# Patient Record
Sex: Male | Born: 1980 | Race: Black or African American | Hispanic: No | Marital: Married | Smoking: Never smoker
Health system: Southern US, Community
[De-identification: ages and names within clinical notes are randomized; demographics above are authoritative.]

## PROBLEM LIST (undated history)

## (undated) DIAGNOSIS — M549 Dorsalgia, unspecified: Secondary | ICD-10-CM

## (undated) DIAGNOSIS — A6002 Herpesviral infection of other male genital organs: Secondary | ICD-10-CM

## (undated) HISTORY — PX: WISDOM TOOTH EXTRACTION: SHX21

---

## 2001-11-13 ENCOUNTER — Encounter: Payer: Self-pay | Admitting: Emergency Medicine

## 2001-11-13 ENCOUNTER — Emergency Department (HOSPITAL_COMMUNITY): Admission: EM | Admit: 2001-11-13 | Discharge: 2001-11-13 | Payer: Self-pay | Admitting: Emergency Medicine

## 2002-11-24 ENCOUNTER — Emergency Department (HOSPITAL_COMMUNITY): Admission: EM | Admit: 2002-11-24 | Discharge: 2002-11-24 | Payer: Self-pay | Admitting: Emergency Medicine

## 2002-11-24 ENCOUNTER — Encounter: Payer: Self-pay | Admitting: Emergency Medicine

## 2003-02-18 ENCOUNTER — Emergency Department (HOSPITAL_COMMUNITY): Admission: EM | Admit: 2003-02-18 | Discharge: 2003-02-18 | Payer: Self-pay | Admitting: Emergency Medicine

## 2005-12-07 ENCOUNTER — Emergency Department (HOSPITAL_COMMUNITY): Admission: EM | Admit: 2005-12-07 | Discharge: 2005-12-08 | Payer: Self-pay | Admitting: Emergency Medicine

## 2005-12-18 ENCOUNTER — Emergency Department (HOSPITAL_COMMUNITY): Admission: EM | Admit: 2005-12-18 | Discharge: 2005-12-18 | Payer: Self-pay | Admitting: Emergency Medicine

## 2006-09-06 ENCOUNTER — Emergency Department (HOSPITAL_COMMUNITY): Admission: EM | Admit: 2006-09-06 | Discharge: 2006-09-06 | Payer: Self-pay | Admitting: Emergency Medicine
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
## 2009-03-21 ENCOUNTER — Ambulatory Visit (HOSPITAL_COMMUNITY): Admission: RE | Admit: 2009-03-21 | Discharge: 2009-03-21 | Payer: Self-pay | Admitting: Chiropractic Medicine

## 2011-08-15 ENCOUNTER — Encounter: Payer: Self-pay | Admitting: Family Medicine

## 2011-08-15 ENCOUNTER — Ambulatory Visit (INDEPENDENT_AMBULATORY_CARE_PROVIDER_SITE_OTHER): Payer: BC Managed Care – PPO | Admitting: Family Medicine

## 2011-08-15 VITALS — BP 129/92 | HR 89 | Temp 98.2°F | Resp 18 | Ht 69.0 in | Wt 295.0 lb

## 2011-08-15 DIAGNOSIS — H6120 Impacted cerumen, unspecified ear: Secondary | ICD-10-CM

## 2011-08-15 DIAGNOSIS — IMO0001 Reserved for inherently not codable concepts without codable children: Secondary | ICD-10-CM

## 2011-08-15 DIAGNOSIS — D72819 Decreased white blood cell count, unspecified: Secondary | ICD-10-CM

## 2011-08-15 DIAGNOSIS — M62838 Other muscle spasm: Secondary | ICD-10-CM

## 2011-08-15 DIAGNOSIS — H9209 Otalgia, unspecified ear: Secondary | ICD-10-CM

## 2011-08-15 DIAGNOSIS — R5381 Other malaise: Secondary | ICD-10-CM

## 2011-08-15 DIAGNOSIS — M791 Myalgia, unspecified site: Secondary | ICD-10-CM

## 2011-08-15 LAB — POCT CBC
Granulocyte percent: 41.4 %G (ref 37–80)
MID (cbc): 0.5 (ref 0–0.9)
MPV: 8.9 fL (ref 0–99.8)
POC Granulocyte: 1 — AB (ref 2–6.9)
POC MID %: 19.7 %M — AB (ref 0–12)
Platelet Count, POC: 216 10*3/uL (ref 142–424)
RBC: 5.47 M/uL (ref 4.69–6.13)

## 2011-08-15 LAB — POCT SEDIMENTATION RATE: POCT SED RATE: 14 mm/hr (ref 0–22)

## 2011-08-15 LAB — COMPREHENSIVE METABOLIC PANEL
AST: 23 U/L (ref 0–37)
Albumin: 4.3 g/dL (ref 3.5–5.2)
Alkaline Phosphatase: 93 U/L (ref 39–117)
Potassium: 4.5 mEq/L (ref 3.5–5.3)
Sodium: 141 mEq/L (ref 135–145)
Total Bilirubin: 0.6 mg/dL (ref 0.3–1.2)
Total Protein: 7.2 g/dL (ref 6.0–8.3)

## 2011-08-15 NOTE — Progress Notes (Signed)
Patient Name: Aaron Lindsey Date of Birth: 03-09-1981 Medical Record Number: [id number] Gender: male Date of Encounter: 08/15/2011  History of Present Illness:  Aaron Lindsey is a 31 y.o. very pleasant male patient who presents with the following:  He is here today with muscle aches "all over my body" for the last 2 days.  Nothing unusual happened except 3 days ago he ate very buttery popcorn at the movies and then had a little diarrhea- 3 episodes over 2 days.  No fever, but he did note some chills yesterday for a short while.  No St, no cough.   No medications or supplements used.   He feels just like he's "had a hard work- out" but he has not been working out.   He does not feel that his joints hurt- symptoms are in his muscles.   Never had this before. He works a physical job.    Otherwise generally   He also notes that his left ear is blocked with wax- would like irrigation  There is no problem list on file for this patient.  No past medical history on file. No past surgical history on file. History  Substance Use Topics  . Smoking status: Never Smoker   . Smokeless tobacco: Not on file  . Alcohol Use: Not on file   No family history on file. No Known Allergies  Medication list has been reviewed and updated.  Review of Systems: As per HPI- otherwise negative.  Physical Examination: Filed Vitals:   08/15/11 0954  BP: 129/92  Pulse: 89  Temp: 98.2 F (36.8 C)  TempSrc: Oral  Resp: 18  Height: 5\' 9"  (1.753 m)  Weight: 295 lb (133.811 kg)   Body mass index is 43.56 kg/(m^2).  GEN: WDWN, NAD, Non-toxic, A & O x 3, obese HEENT: Atraumatic, Normocephalic. Neck supple. No masses, No LAD.  Oropharynx wnl. Left TM blocked with cerumen, right wnl Ears and Nose: No external deformity. CV: RRR, No M/G/R. No JVD. No thrill. No extra heart sounds. PULM: CTA B, no wheezes, crackles, rhonchi. No retractions. No resp. distress. No accessory muscle use. ABD: S, NT,  ND, +BS. No rebound. No HSM. EXTR: No c/c/e NEURO Normal gait.  PSYCH: Normally interactive. Conversant. Not depressed or anxious appearing.  Calm demeanor.  MSK: no pain or swelling on palpation of extremities: muscles and joints seem normal and he has normal ROM and strength  Left ear irrigated- wax removed, normal TM after irrigation  Results for orders placed in visit on 08/15/11  POCT CBC      Component Value Range   WBC 2.3 (*) 4.6 - 10.2 (K/uL)   Lymph, poc 0.9  0.6 - 3.4    POC LYMPH PERCENT 38.9  10 - 50 (%L)   MID (cbc) 0.5  0 - 0.9    POC MID % 19.7 (*) 0 - 12 (%M)   POC Granulocyte 1.0 (*) 2 - 6.9    Granulocyte percent 41.4  37 - 80 (%G)   RBC 5.47  4.69 - 6.13 (M/uL)   Hemoglobin 15.0  14.1 - 18.1 (g/dL)   HCT, POC 40.9  81.1 - 53.7 (%)   MCV 84.1  80 - 97 (fL)   MCH, POC 27.4  27 - 31.2 (pg)   MCHC 32.6  31.8 - 35.4 (g/dL)   RDW, POC 91.4     Platelet Count, POC 216  142 - 424 (K/uL)   MPV 8.9  0 - 99.8 (fL)  POCT  SEDIMENTATION RATE      Component Value Range   POCT SED RATE 14  0 - 22 (mm/hr)  POCT INFLUENZA A/B      Component Value Range   Influenza A, POC Negative     Influenza B, POC Negative     Review old CBC in chart from 2007- normal wbc count Assessment and Plan: 1. Muscle pain  POCT CBC, POCT SEDIMENTATION RATE, Comprehensive metabolic panel, ANA, CK, POCT Influenza A/B  2. Leukopenia     Suspect that Wing has a viral illness causing his symptoms- however will check other labs as above.  Cautioned him to keep an eye on his temperature and his symptoms- if he gets worse or has other symptoms such as fever please call or RTC.  Otherwise I will check on him in a day or so when the rest of his labs come in.

## 2011-08-16 ENCOUNTER — Encounter: Payer: Self-pay | Admitting: Family Medicine

## 2013-11-10 ENCOUNTER — Emergency Department (HOSPITAL_COMMUNITY)
Admission: EM | Admit: 2013-11-10 | Discharge: 2013-11-10 | Disposition: A | Discharge status: Home / Self Care | Payer: BC Managed Care – PPO | Source: Home / Self Care | Attending: Family Medicine | Admitting: Family Medicine

## 2013-11-10 ENCOUNTER — Other Ambulatory Visit (HOSPITAL_COMMUNITY)
Admission: RE | Admit: 2013-11-10 | Discharge: 2013-11-10 | Disposition: A | Discharge status: Home / Self Care | Payer: BC Managed Care – PPO | Source: Ambulatory Visit | Attending: Family Medicine | Admitting: Family Medicine

## 2013-11-10 ENCOUNTER — Encounter (HOSPITAL_COMMUNITY): Payer: Self-pay | Admitting: Emergency Medicine

## 2013-11-10 DIAGNOSIS — Z113 Encounter for screening for infections with a predominantly sexual mode of transmission: Secondary | ICD-10-CM | POA: Insufficient documentation

## 2013-11-10 DIAGNOSIS — N4889 Other specified disorders of penis: Secondary | ICD-10-CM

## 2013-11-10 LAB — POCT URINALYSIS DIP (DEVICE)
Bilirubin Urine: NEGATIVE
Glucose, UA: NEGATIVE mg/dL
Hgb urine dipstick: NEGATIVE
Ketones, ur: NEGATIVE mg/dL
Leukocytes, UA: NEGATIVE
NITRITE: NEGATIVE
PROTEIN: 30 mg/dL — AB
SPECIFIC GRAVITY, URINE: 1.02 (ref 1.005–1.030)
UROBILINOGEN UA: 1 mg/dL (ref 0.0–1.0)
pH: 7 (ref 5.0–8.0)

## 2013-11-10 MED ORDER — VALACYCLOVIR HCL 500 MG PO TABS: 500.0000 mg | ORAL_TABLET | Freq: Two times a day (BID) | ORAL | Status: AC

## 2013-11-10 NOTE — Discharge Instructions (Signed)
Thank you for coming in today. Take valtrex twice daily for 5 days.  Come back as needed.   Genital Herpes Genital herpes is a sexually transmitted disease. This means that it is a disease passed by having sex with an infected person. There is no cure for genital herpes. The time between attacks can be months to years. The virus may live in a person but produce no problems (symptoms). This infection can be passed to a baby as it travels down the birth canal (vagina). In a newborn, this can cause central nervous system damage, eye damage, or even death. The virus that causes genital herpes is usually HSV-2 virus. The virus that causes oral herpes is usually HSV-1. The diagnosis (learning what is wrong) is made through culture results. SYMPTOMS  Usually symptoms of pain and itching begin a few days to a week after contact. It first appears as small blisters that progress to small painful ulcers which then scab over and heal after several days. It affects the outer genitalia, birth canal, cervix, penis, anal area, buttocks, and thighs. HOME CARE INSTRUCTIONS   Keep ulcerated areas dry and clean.  Take medications as directed. Antiviral medications can speed up healing. They will not prevent recurrences or cure this infection. These medications can also be taken for suppression if there are frequent recurrences.  While the infection is active, it is contagious. Avoid all sexual contact during active infections.  Condoms may help prevent spread of the herpes virus.  Practice safe sex.  Wash your hands thoroughly after touching the genital area.  Avoid touching your eyes after touching your genital area.  Inform your caregiver if you have had genital herpes and become pregnant. It is your responsibility to insure a safe outcome for your baby in this pregnancy.  Only take over-the-counter or prescription medicines for pain, discomfort, or fever as directed by your caregiver. SEEK MEDICAL CARE IF:    You have a recurrence of this infection.  You do not respond to medications and are not improving.  You have new sources of pain or discharge which have changed from the original infection.  You have an oral temperature above 102 F (38.9 C).  You develop abdominal pain.  You develop eye pain or signs of eye infection. Document Released: 03/23/2000 Document Revised: 06/18/2011 Document Reviewed: 04/13/2009 [facility] Patient Information 2015 [employer]. This information is not intended to replace advice given to you by your health care provider. Make sure you discuss any questions you have with your health care provider.

## 2013-11-10 NOTE — ED Provider Notes (Signed)
Aaron Lindsey is a 33 y.o. male who presents to Urgent Care today for penile tingling. Patient has a history of genital herpes and notes over the past few days tingling. He denies any rash or discharge. He feels well otherwise. He feels well otherwise. No fevers or chills nausea vomiting or diarrhea.   History reviewed. No pertinent past medical history. History  Substance Use Topics  . Smoking status: Never Smoker   . Smokeless tobacco: Not on file  . Alcohol Use: Not on file   ROS as above Medications: No current facility-administered medications for this encounter.   Current Outpatient Prescriptions  Medication Sig Dispense Refill  . valACYclovir (VALTREX) 500 MG tablet Take 1 tablet (500 mg total) by mouth 2 (two) times daily.  10 tablet  0    Exam:  BP 123/80  Pulse 77  Temp(Src) 99.3 F (37.4 C) (Oral)  Resp 16  SpO2 99% Gen: Well NAD Genitals: Normal appearing penis. No lesions noted. Testicles are descended bilaterally and nontender with no mass.  Results for orders placed during the hospital encounter of 11/10/13 (from the past 24 hour(s))  POCT URINALYSIS DIP (DEVICE)     Status: Abnormal   Collection Time    11/10/13  8:49 AM      Result Value Ref Range   Glucose, UA NEGATIVE  NEGATIVE mg/dL   Bilirubin Urine NEGATIVE  NEGATIVE   Ketones, ur NEGATIVE  NEGATIVE mg/dL   Specific Gravity, Urine 1.020  1.005 - 1.030   Hgb urine dipstick NEGATIVE  NEGATIVE   pH 7.0  5.0 - 8.0   Protein, ur 30 (*) NEGATIVE mg/dL   Urobilinogen, UA 1.0  0.0 - 1.0 mg/dL   Nitrite NEGATIVE  NEGATIVE   Leukocytes, UA NEGATIVE  NEGATIVE   No results found.  Assessment and Plan: 33 y.o. male with penile tingling. Likely onset of herpes outbreak. Plan to treat with Valtrex. Followup as needed. Urine cytology pending  Discussed warning signs or symptoms. Please see discharge instructions. Patient expresses understanding.   This note was created using [employer] voice recognition  software. Any transcription errors are unintended.    Rodolph BongEvan S Svea Pusch, MD 11/10/13 [phone]

## 2013-11-10 NOTE — ED Notes (Signed)
C/o cystitis  See physician note

## 2013-11-12 ENCOUNTER — Emergency Department (INDEPENDENT_AMBULATORY_CARE_PROVIDER_SITE_OTHER)
Admission: EM | Admit: 2013-11-12 | Discharge: 2013-11-12 | Disposition: A | Discharge status: Home / Self Care | Payer: BC Managed Care – PPO | Source: Home / Self Care | Attending: Emergency Medicine | Admitting: Emergency Medicine

## 2013-11-12 ENCOUNTER — Telehealth (HOSPITAL_COMMUNITY): Payer: Self-pay | Admitting: *Deleted

## 2013-11-12 ENCOUNTER — Encounter (HOSPITAL_COMMUNITY): Payer: Self-pay | Admitting: Emergency Medicine

## 2013-11-12 DIAGNOSIS — Z202 Contact with and (suspected) exposure to infections with a predominantly sexual mode of transmission: Secondary | ICD-10-CM

## 2013-11-12 HISTORY — DX: Herpesviral infection of other male genital organs: A60.02

## 2013-11-12 NOTE — Discharge Instructions (Signed)
You have been diagnosed with a possible STD.  Your results should be back in  1 - 3 days.  We will call you with the results of any positive tests, so if you don't hear from us, you can assume your results are all negative.  If you wish, you can call us here at [phone] and ask for a nurse to give you the results.  They can give you the results of all your tests over the phone.  If your HIV should come back positive, we must give you this result in person to protect your confidentiality.  We can give you a negative HIV result over the phone.   ° °In the meantime, you should avoid intercourse altogether for 1 week.  After that, you should always use condoms--100% of the time.  This will not only prevent pregnancy, but has been shown to prevent HIV, syphilis, gonorrhea, chlamydia, hepatis C and other STDs. ° °If your test comes back positive, we are required by law to report it to the Health Department.  We also suggest you inform your partner or partners so they can get tested and treated as well. ° °You can get STD testing for free at the [facility].  It is recommended that you have repeat testing for HIV and syphilis in 3 and 6 months, since it can take a while for these tests to become positive.  ° °

## 2013-11-12 NOTE — ED Notes (Signed)
Pt. Cclled 4 x for his lab results on the VM. I called pt. and told him I was not here yesterday and did not come in today until 1400 but I had received all his calls.  Pt. verified x 2 and given results. ( GC/Chlamydia/Trich all neg.) Pt. asked if that was all we tested for.  I said yes.  If he wanted HIV/RPR he should have asked the doctor while he was here. He asked about the Herpes test. I explained that he would need to have a sore that we can swab and do a culture.  I advised to practice safe sex and always wear a condom. If he wants the other tests [facility].   Vassie MoselleYork, Emerson Schreifels M 11/12/2013

## 2013-11-12 NOTE — ED Provider Notes (Signed)
  Chief Complaint   Chief Complaint  Patient presents with  . Exposure to STD    History of Present Illness   Aaron NayChristopher Lindsey is a 33 year old male who was here 3 days ago with a tingly feeling on the tip of his penis. He underwent DNA probe testing which ultimately was negative. He did not have any serologies drawn. He was given Valtrex because of a history of HSV in the past. The tingling has persisted. He states it sometimes radiates down into his feet. He denies any urethral discharge or penile lesions. No frequency, urgency, dysuria, lower back, or lower abdominal pain. No fever, chills, sore throat, skin rash, or joint pain. He comes in today for serologies for HIV syphilis, since he wants to rule these diseases out and he has had unprotected intercourse.  Review of Systems   Other than as noted above, the patient denies any of the following symptoms: Systemic:  No fevers chills, arthralgias, or adenopathy. GI:  No abdominal pain, nausea or vomiting. GU:  No dysuria, penile pain, discharge, itching, dysuria, genital lesions, testicular pain or swelling. Skin:  No rash or itching.  PMFSH   Past medical history, family history, social history, meds, and allergies were reviewed.   Physical Examination    Vital signs:  BP 122/86  Pulse 65  Temp(Src) 98.1 F (36.7 C) (Oral)  Resp 16  SpO2 98% Gen:  Alert, oriented, in no distress. Abdomen:  Soft and flat, non-distended, and non-tender.  No organomegaly or mass. Genital:  Normal exam. No urethral discharge, no penile lesions. Testes were normal. No inguinal lymphadenopathy. Skin:  Warm and dry.  No rash.   Labs   At his request serologies for HIV and syphilis were obtained.  Assessment   The encounter diagnosis was Potential exposure to STD.  Plan    1.  Meds:  The following meds were prescribed:   Discharge Medication List as of 11/12/2013  5:51 PM      2.  Patient Education/Counseling:  The patient was given  appropriate handouts, self care instructions, and instructed in symptomatic relief.The patient was instructed to inform all sexual contacts, avoid intercourse completely for 2 weeks and then only with a condom.  The patient was told that we would call about all abnormal lab results, and that we would need to report certain kinds of infection to the health department.    3.  Follow up:  The patient was told to follow up here if no better in 3 to 4 days, or sooner if becoming worse in any way, and given some red flag symptoms such as fever, pain, or difficulty urinating which would prompt immediate return.       Reuben Likesavid C Trini Christiansen, MD 11/12/13 2119

## 2013-11-12 NOTE — ED Notes (Signed)
Here for HIV/RPR testing.  Pt. had neg. Testing for the others on 8/4.  Pt. already knows he is pos. for Herpes.

## 2013-11-13 LAB — RPR

## 2013-11-13 LAB — HIV ANTIBODY (ROUTINE TESTING W REFLEX): HIV: NONREACTIVE

## 2014-07-21 ENCOUNTER — Other Ambulatory Visit: Payer: Self-pay | Admitting: Sports Medicine

## 2014-07-21 DIAGNOSIS — M545 Low back pain, unspecified: Secondary | ICD-10-CM

## 2014-07-26 ENCOUNTER — Ambulatory Visit
Admission: RE | Admit: 2014-07-26 | Discharge: 2014-07-26 | Disposition: A | Discharge status: Home / Self Care | Payer: BLUE CROSS/BLUE SHIELD | Source: Ambulatory Visit | Attending: Sports Medicine | Admitting: Sports Medicine

## 2014-07-26 VITALS — BP 158/90 | HR 72

## 2014-07-26 DIAGNOSIS — M545 Low back pain, unspecified: Secondary | ICD-10-CM

## 2014-07-26 DIAGNOSIS — M5137 Other intervertebral disc degeneration, lumbosacral region: Secondary | ICD-10-CM

## 2014-07-26 MED ORDER — METHYLPREDNISOLONE ACETATE 40 MG/ML INJ SUSP (RADIOLOG
120.0000 mg | Freq: Once | INTRAMUSCULAR | Status: AC
  Administered 2014-07-26: 120 mg via EPIDURAL

## 2014-07-26 MED ORDER — IOHEXOL 180 MG/ML  SOLN
1.0000 mL | Freq: Once | INTRAMUSCULAR | Status: AC | PRN
  Administered 2014-07-26: 1 mL via EPIDURAL

## 2014-07-26 NOTE — Discharge Instructions (Signed)

## 2014-07-28 ENCOUNTER — Other Ambulatory Visit: Payer: Self-pay

## 2014-08-06 ENCOUNTER — Other Ambulatory Visit: Payer: Self-pay | Admitting: Sports Medicine

## 2014-08-06 DIAGNOSIS — M5126 Other intervertebral disc displacement, lumbar region: Secondary | ICD-10-CM

## 2014-08-13 ENCOUNTER — Ambulatory Visit
Admission: RE | Admit: 2014-08-13 | Discharge: 2014-08-13 | Disposition: A | Discharge status: Home / Self Care | Payer: BLUE CROSS/BLUE SHIELD | Source: Ambulatory Visit | Attending: Sports Medicine | Admitting: Sports Medicine

## 2014-08-13 DIAGNOSIS — M5126 Other intervertebral disc displacement, lumbar region: Secondary | ICD-10-CM

## 2014-08-13 MED ORDER — METHYLPREDNISOLONE ACETATE 40 MG/ML INJ SUSP (RADIOLOG
120.0000 mg | Freq: Once | INTRAMUSCULAR | Status: AC
  Administered 2014-08-13: 120 mg via EPIDURAL

## 2014-08-13 MED ORDER — IOHEXOL 180 MG/ML  SOLN
1.0000 mL | Freq: Once | INTRAMUSCULAR | Status: AC | PRN
  Administered 2014-08-13: 1 mL via EPIDURAL

## 2016-09-06 DIAGNOSIS — M5432 Sciatica, left side: Secondary | ICD-10-CM | POA: Insufficient documentation

## 2016-09-06 DIAGNOSIS — R51 Headache: Secondary | ICD-10-CM | POA: Diagnosis present

## 2016-09-07 ENCOUNTER — Emergency Department (HOSPITAL_COMMUNITY)
Admission: EM | Admit: 2016-09-07 | Discharge: 2016-09-07 | Disposition: A | Discharge status: Home / Self Care | Payer: BLUE CROSS/BLUE SHIELD | Attending: Emergency Medicine | Admitting: Emergency Medicine

## 2016-09-07 ENCOUNTER — Encounter (HOSPITAL_COMMUNITY): Payer: Self-pay

## 2016-09-07 DIAGNOSIS — R519 Headache, unspecified: Secondary | ICD-10-CM

## 2016-09-07 DIAGNOSIS — M5432 Sciatica, left side: Secondary | ICD-10-CM

## 2016-09-07 DIAGNOSIS — R51 Headache: Secondary | ICD-10-CM

## 2016-09-07 HISTORY — DX: Dorsalgia, unspecified: M54.9

## 2016-09-07 MED ORDER — KETOROLAC TROMETHAMINE 30 MG/ML IJ SOLN
30.0000 mg | Freq: Once | INTRAMUSCULAR | Status: AC
  Administered 2016-09-07: 30 mg via INTRAVENOUS
  Filled 2016-09-07: qty 1

## 2016-09-07 MED ORDER — MORPHINE SULFATE (PF) 2 MG/ML IV SOLN
4.0000 mg | Freq: Once | INTRAVENOUS | Status: AC
  Administered 2016-09-07: 4 mg via INTRAVENOUS
  Filled 2016-09-07: qty 2

## 2016-09-07 MED ORDER — ORPHENADRINE CITRATE ER 100 MG PO TB12: 100.0000 mg | ORAL_TABLET | Freq: Two times a day (BID) | ORAL | 0 refills | Status: DC

## 2016-09-07 MED ORDER — METHOCARBAMOL 1000 MG/10ML IJ SOLN: 1000.0000 mg | Freq: Once | INTRAMUSCULAR | Status: DC

## 2016-09-07 MED ORDER — DIPHENHYDRAMINE HCL 50 MG/ML IJ SOLN
25.0000 mg | Freq: Once | INTRAMUSCULAR | Status: AC
  Administered 2016-09-07: 25 mg via INTRAVENOUS
  Filled 2016-09-07: qty 1

## 2016-09-07 MED ORDER — SODIUM CHLORIDE 0.9 % IV BOLUS (SEPSIS)
1000.0000 mL | Freq: Once | INTRAVENOUS | Status: AC
  Administered 2016-09-07: 1000 mL via INTRAVENOUS

## 2016-09-07 MED ORDER — DEXTROSE 5 % IV SOLN
1000.0000 mg | Freq: Once | INTRAVENOUS | Status: AC
  Administered 2016-09-07: 1000 mg via INTRAVENOUS
  Filled 2016-09-07: qty 10

## 2016-09-07 MED ORDER — NAPROXEN 500 MG PO TABS: 500.0000 mg | ORAL_TABLET | Freq: Two times a day (BID) | ORAL | 0 refills | Status: DC

## 2016-09-07 MED ORDER — METOCLOPRAMIDE HCL 5 MG/ML IJ SOLN
10.0000 mg | Freq: Once | INTRAMUSCULAR | Status: AC
  Administered 2016-09-07: 10 mg via INTRAVENOUS
  Filled 2016-09-07: qty 2

## 2016-09-07 NOTE — Discharge Instructions (Signed)
Continue to take your hydrocodone-acetaminophen as needed for pain.

## 2016-09-07 NOTE — ED Provider Notes (Signed)
[facility]-EMERGENCY DEPT Provider Note   CSN: [id number] Arrival date & time: 09/06/16  2322  By signing my name below, I, Aaron Lindsey, attest that this documentation has been prepared under the direction and in the presence of Dione Booze, MD. Electronically Signed: Karren Cobble, ED Scribe. 09/07/16. 2:53 AM.  History   Chief Complaint Chief Complaint  Patient presents with  . Headache   The history is provided by the patient. No language interpreter was used.    HPI HPI Comments: Aaron Lindsey is a 36 y.o. male who presents to the Emergency Department complaining of throbbing frontal headache for one day. He notes the pain is associated with his chronic back pain. Pt also reports nausea with weakness, numbness, and tingling in his left leg. Pt reports his sciatic nerve pain is causing him to have this severe headache. He notes he is unable to get comfortable and sleep due to the pain. Pt is currently followed for pain management. Not followed by a neurologist. Denies emesis.   Past Medical History:  Diagnosis Date  . Back pain   . Herpes genitalis in men    There are no active problems to display for this patient.  History reviewed. No pertinent surgical history.  Home Medications    Prior to Admission medications   Not on File   Family History Family History  Problem Relation Age of Onset  . Stroke Mother   . Heart failure Father    Social History Social History  Substance Use Topics  . Smoking status: Never Smoker  . Smokeless tobacco: Never Used  . Alcohol use No   Allergies   Patient has no known allergies.  Review of Systems Review of Systems  Musculoskeletal: Positive for back pain.  Neurological: Positive for weakness, numbness and headaches.       Tingling to the left leg.   Psychiatric/Behavioral: Positive for sleep disturbance.   Physical Exam Updated Vital Signs BP (!) 162/123 (BP Location: Right Arm)   Pulse (!) 112   Temp 98.3 F (36.8 C)  (Oral)   Resp 20   SpO2 100%   Physical Exam  Constitutional: He is oriented to person, place, and time. He appears well-developed and well-nourished.  HENT:  Head: Normocephalic and atraumatic.  Eyes: EOM are normal. Pupils are equal, round, and reactive to light.  Neck: Normal range of motion. Neck supple. No JVD present.  Cardiovascular: Normal rate, regular rhythm and normal heart sounds.   No murmur heard. Pulmonary/Chest: Effort normal and breath sounds normal. He has no wheezes. He has no rales. He exhibits no tenderness.  Abdominal: Soft. Bowel sounds are normal. He exhibits no distension and no mass. There is no tenderness.  Musculoskeletal: Normal range of motion. He exhibits no edema.  +straight leg raise left @ 30 degrees  +crossed leg raise on right @ 30 degree  Lymphadenopathy:    He has no cervical adenopathy.  Neurological: He is alert and oriented to person, place, and time. No cranial nerve deficit. He exhibits normal muscle tone. Coordination normal.  Decreased senstaion in the left foot. Not following dermatone distrubution.   Skin: Skin is warm and dry. No rash noted.  Psychiatric: He has a normal mood and affect. His behavior is normal. Judgment and thought content normal.  Nursing note and vitals reviewed.  ED Treatments / Results  DIAGNOSTIC STUDIES: Oxygen Saturation is 100% on RA, normal by my interpretation.   COORDINATION OF CARE: 2:08 AM-Discussed next steps with pt. Pt  verbalized understanding and is agreeable with the plan.   Procedures Procedures (including critical care time)  Medications Ordered in ED Medications  sodium chloride 0.9 % bolus 1,000 mL (0 mLs Intravenous Stopped 09/07/16 0410)  ketorolac (TORADOL) 30 MG/ML injection 30 mg (30 mg Intravenous Given 09/07/16 0250)  metoCLOPramide (REGLAN) injection 10 mg (10 mg Intravenous Given 09/07/16 0250)  diphenhydrAMINE (BENADRYL) injection 25 mg (25 mg Intravenous Given 09/07/16 0250)  morphine  2 MG/ML injection 4 mg (4 mg Intravenous Given 09/07/16 0442)  methocarbamol (ROBAXIN) 1,000 mg in dextrose 5 % 50 mL IVPB (0 mg Intravenous Stopped 09/07/16 0615)     Initial Impression / Assessment and Plan / ED Course  I have reviewed the triage vital signs and the nursing notes.  Headache which I suspect is mostly muscle contraction headache. No red flags to suggest more serious causes. Left-sided sciatica. No evidence of acute neurologic deficit. Old records are reviewed, and he has no relevant past visits. Is given a headache cocktail of normal saline, metoclopramide, diphenhydramine, ketorolac which did not seem to give him any relief. He was given a dose of morphine and methocarbamol. Following this, headache was on this completely gone, and sciatic pain was improved but still present. He will need neurosurgical evaluation regarding his sciatica. He is discharged with prescriptions for orphenadrine and naproxen. He is seeing a pain management physician and is to continue taking his narcotics as prescribed from pain management.  Final Clinical Impressions(s) / ED Diagnoses   Final diagnoses:  Bad headache  Left sided sciatica    New Prescriptions New Prescriptions   NAPROXEN (NAPROSYN) 500 MG TABLET    Take 1 tablet (500 mg total) by mouth 2 (two) times daily.   ORPHENADRINE (NORFLEX) 100 MG TABLET    Take 1 tablet (100 mg total) by mouth 2 (two) times daily.   I personally performed the services described in this documentation, which was scribed in my presence. The recorded information has been reviewed and is accurate.       Dione BoozeGlick, Iman Reinertsen, MD 09/07/16 [phone]

## 2016-09-07 NOTE — ED Triage Notes (Signed)
Pt complains of chronic back pain from sciatica which is causing him to have a severe headache and he feels nauseated Pt states he's taking vicodin and doesn't like it

## 2016-09-26 ENCOUNTER — Other Ambulatory Visit: Payer: Self-pay | Admitting: Sports Medicine

## 2016-09-26 DIAGNOSIS — M545 Low back pain: Secondary | ICD-10-CM

## 2016-10-09 ENCOUNTER — Ambulatory Visit
Admission: RE | Admit: 2016-10-09 | Discharge: 2016-10-09 | Disposition: A | Discharge status: Home / Self Care | Payer: BLUE CROSS/BLUE SHIELD | Source: Ambulatory Visit | Attending: Sports Medicine | Admitting: Sports Medicine

## 2016-10-09 DIAGNOSIS — M545 Low back pain: Secondary | ICD-10-CM

## 2016-11-28 ENCOUNTER — Emergency Department (HOSPITAL_COMMUNITY)
Admission: EM | Admit: 2016-11-28 | Discharge: 2016-11-28 | Disposition: A | Discharge status: Home / Self Care | Payer: BLUE CROSS/BLUE SHIELD | Attending: Emergency Medicine | Admitting: Emergency Medicine

## 2016-11-28 ENCOUNTER — Emergency Department (HOSPITAL_COMMUNITY): Payer: BLUE CROSS/BLUE SHIELD

## 2016-11-28 DIAGNOSIS — R55 Syncope and collapse: Secondary | ICD-10-CM | POA: Insufficient documentation

## 2016-11-28 DIAGNOSIS — Z79899 Other long term (current) drug therapy: Secondary | ICD-10-CM | POA: Diagnosis not present

## 2016-11-28 LAB — URINALYSIS, ROUTINE W REFLEX MICROSCOPIC
BILIRUBIN URINE: NEGATIVE
GLUCOSE, UA: NEGATIVE mg/dL
Hgb urine dipstick: NEGATIVE
KETONES UR: 5 mg/dL — AB
Nitrite: NEGATIVE
PH: 7 (ref 5.0–8.0)
Protein, ur: 30 mg/dL — AB
Specific Gravity, Urine: 1.02 (ref 1.005–1.030)

## 2016-11-28 LAB — BASIC METABOLIC PANEL
Anion gap: 6 (ref 5–15)
BUN: 6 mg/dL (ref 6–20)
CALCIUM: 8.6 mg/dL — AB (ref 8.9–10.3)
CHLORIDE: 108 mmol/L (ref 101–111)
CO2: 27 mmol/L (ref 22–32)
CREATININE: 1.23 mg/dL (ref 0.61–1.24)
GFR calc non Af Amer: >60 mL/min (ref >60)
GLUCOSE: 117 mg/dL — AB (ref 65–99)
Potassium: 3.9 mmol/L (ref 3.5–5.1)
Sodium: 141 mmol/L (ref 135–145)

## 2016-11-28 LAB — CBC
HEMATOCRIT: 41 % (ref 39.0–52.0)
HEMOGLOBIN: 13.6 g/dL (ref 13.0–17.0)
MCH: 27.4 pg (ref 26.0–34.0)
MCHC: 33.2 g/dL (ref 30.0–36.0)
MCV: 82.5 fL (ref 78.0–100.0)
Platelets: 183 10*3/uL (ref 150–400)
RBC: 4.97 MIL/uL (ref 4.22–5.81)
RDW: 14.4 % (ref 11.5–15.5)
WBC: 3.7 10*3/uL — ABNORMAL LOW (ref 4.0–10.5)

## 2016-11-28 LAB — HEPATIC FUNCTION PANEL
ALK PHOS: 51 U/L (ref 38–126)
ALT: 21 U/L (ref 17–63)
AST: 28 U/L (ref 15–41)
Albumin: 3.4 g/dL — ABNORMAL LOW (ref 3.5–5.0)
BILIRUBIN INDIRECT: 1 mg/dL — AB (ref 0.3–0.9)
BILIRUBIN TOTAL: 1.2 mg/dL (ref 0.3–1.2)
Bilirubin, Direct: 0.2 mg/dL (ref 0.1–0.5)
TOTAL PROTEIN: 5.4 g/dL — AB (ref 6.5–8.1)

## 2016-11-28 LAB — CBG MONITORING, ED: Glucose-Capillary: 113 mg/dL — ABNORMAL HIGH (ref 65–99)

## 2016-11-28 LAB — TROPONIN I: Troponin I: <0.03 ng/mL (ref <0.03)

## 2016-11-28 LAB — CK: CK TOTAL: 549 U/L — AB (ref 49–397)

## 2016-11-28 MED ORDER — SODIUM CHLORIDE 0.9 % IV SOLN
INTRAVENOUS | Status: DC
  Administered 2016-11-28: 15:00:00 via INTRAVENOUS

## 2016-11-28 MED ORDER — SODIUM CHLORIDE 0.9 % IV BOLUS (SEPSIS)
2000.0000 mL | Freq: Once | INTRAVENOUS | Status: AC
  Administered 2016-11-28: 2000 mL via INTRAVENOUS

## 2016-11-28 NOTE — ED Notes (Signed)
Bed: WA13 Expected date:  Expected time:  Means of arrival:  Comments: EMS syncope 

## 2016-11-28 NOTE — ED Triage Notes (Signed)
Per EMS, pt c/o 2 closely-spaced episode syncopes with fall from standing, onset today after first day of weight lifting "in a long time." Pt reports taking "Hydroxycut" x 2 weeks and not drinking enough water today. Pt denies any symptoms currently. No CP or SOB with syncope. 500 mL NS administered en route.

## 2016-11-28 NOTE — ED Provider Notes (Signed)
[facility] Provider Note   CSN: [id number] Arrival date & time: 11/28/16  1311     History   Chief Complaint Chief Complaint  Patient presents with  . Loss of Consciousness  . Hypotension    HPI Aaron Lindsey is a 36 y.o. male.  36 year old male here after syncopal event while working out.this is patient's second day working on after a prolonged absence from physical training. Notes that he only had a banana and water and then took Hydroxycut which is a stimulant. Had 2 episodes of becoming dizzy after exercise. Denies any chest pain or shortness of breath. States when he went to stand up he had a brief syncopal event for about 3 seconds. Could tell that he was going to pass out. Did not have any seizure or postictal phase. EMS was called and patient given IV fluids and patient feels much better now. He denies any dark urine at this time.      Past Medical History:  Diagnosis Date  . Back pain   . Herpes genitalis in men     There are no active problems to display for this patient.   No past surgical history on file.     Home Medications    Prior to Admission medications   Medication Sig Start Date End Date Taking? Authorizing Provider  HYDROcodone-acetaminophen (NORCO) 10-325 MG tablet Take 1 tablet by mouth 3 (three) times daily.    [provider]  montelukast (SINGULAIR) 10 MG tablet Take 10 mg by mouth at bedtime.    [provider]  naproxen (NAPROSYN) 500 MG tablet Take 1 tablet (500 mg total) by mouth 2 (two) times daily. 09/07/16   Dione Booze, MD  orphenadrine (NORFLEX) 100 MG tablet Take 1 tablet (100 mg total) by mouth 2 (two) times daily. 09/07/16   Dione Booze, MD    Family History Family History  Problem Relation Age of Onset  . Stroke Mother   . Heart failure Father     Social History Social History  Substance Use Topics  . Smoking status: Never Smoker  . Smokeless tobacco: Never Used  . Alcohol use No      Allergies   Patient has no known allergies.   Review of Systems Review of Systems  All other systems reviewed and are negative.    Physical Exam Updated Vital Signs BP 112/76   Pulse 86   Temp 98.2 F (36.8 C) (Oral)   Resp (!) 24   SpO2 98%   Physical Exam  Constitutional: He is oriented to person, place, and time. He appears well-developed and well-nourished.  Non-toxic appearance. No distress.  HENT:  Head: Normocephalic and atraumatic.  Eyes: Pupils are equal, round, and reactive to light. Conjunctivae, EOM and lids are normal.  Neck: Normal range of motion. Neck supple. No tracheal deviation present. No thyroid mass present.  Cardiovascular: Normal rate, regular rhythm and normal heart sounds.  Exam reveals no gallop.   No murmur heard. Pulmonary/Chest: Effort normal and breath sounds normal. No stridor. No respiratory distress. He has no decreased breath sounds. He has no wheezes. He has no rhonchi. He has no rales.  Abdominal: Soft. Normal appearance and bowel sounds are normal. He exhibits no distension. There is no tenderness. There is no rebound and no CVA tenderness.  Musculoskeletal: Normal range of motion. He exhibits no edema or tenderness.  Neurological: He is alert and oriented to person, place, and time. He has normal strength. No cranial nerve deficit  or sensory deficit. GCS eye subscore is 4. GCS verbal subscore is 5. GCS motor subscore is 6.  Skin: Skin is warm and dry. No abrasion and no rash noted.  Psychiatric: He has a normal mood and affect. His speech is normal and behavior is normal.  Nursing note and vitals reviewed.    ED Treatments / Results  Labs (all labs ordered are listed, but only abnormal results are displayed) Labs Reviewed  CBC - Abnormal; Notable for the following:       Result Value   WBC 3.7 (*)    All other components within normal limits  CBG MONITORING, ED - Abnormal; Notable for the following:    Glucose-Capillary 113  (*)    All other components within normal limits  BASIC METABOLIC PANEL  URINALYSIS, ROUTINE W REFLEX MICROSCOPIC  HEPATIC FUNCTION PANEL  CK    EKG  EKG Interpretation  Date/Time:  Wednesday November 28 2016 13:28:12 EDT Ventricular Rate:  76 PR Interval:    QRS Duration: 87 QT Interval:  380 QTC Calculation: 428 R Axis:   4 Text Interpretation:  Sinus rhythm Confirmed by Lorre Nick ([id number]) on 11/28/2016 1:55:14 PM       Radiology No results found.  Procedures Procedures (including critical care time)  Medications Ordered in ED Medications  sodium chloride 0.9 % bolus 2,000 mL (not administered)  0.9 %  sodium chloride infusion (not administered)     Initial Impression / Assessment and Plan / ED Course  I have reviewed the triage vital signs and the nursing notes.  Pertinent labs & imaging results that were available during my care of the patient were reviewed by me and considered in my medical decision making (see chart for details).     Patient given IV fluids here and is not orthostatic. No evidence of rhabdomyolysis. EKG is reassuring. Troponin negative. Feels better likely vagal episode and patient stable for discharge  Final Clinical Impressions(s) / ED Diagnoses   Final diagnoses:  None    New Prescriptions New Prescriptions   No medications on file     Lorre Nick, MD 11/28/16 1517

## 2018-09-19 IMAGING — CR DG CHEST 2V
2 series · 2 of 2 positions shown · non-contrast
Comparison: 09/09/2006 chest radiograph

CLINICAL DATA: 36 y/o  M; syncopal episodes.

EXAM:
CHEST  2 VIEW

[w chest pa]
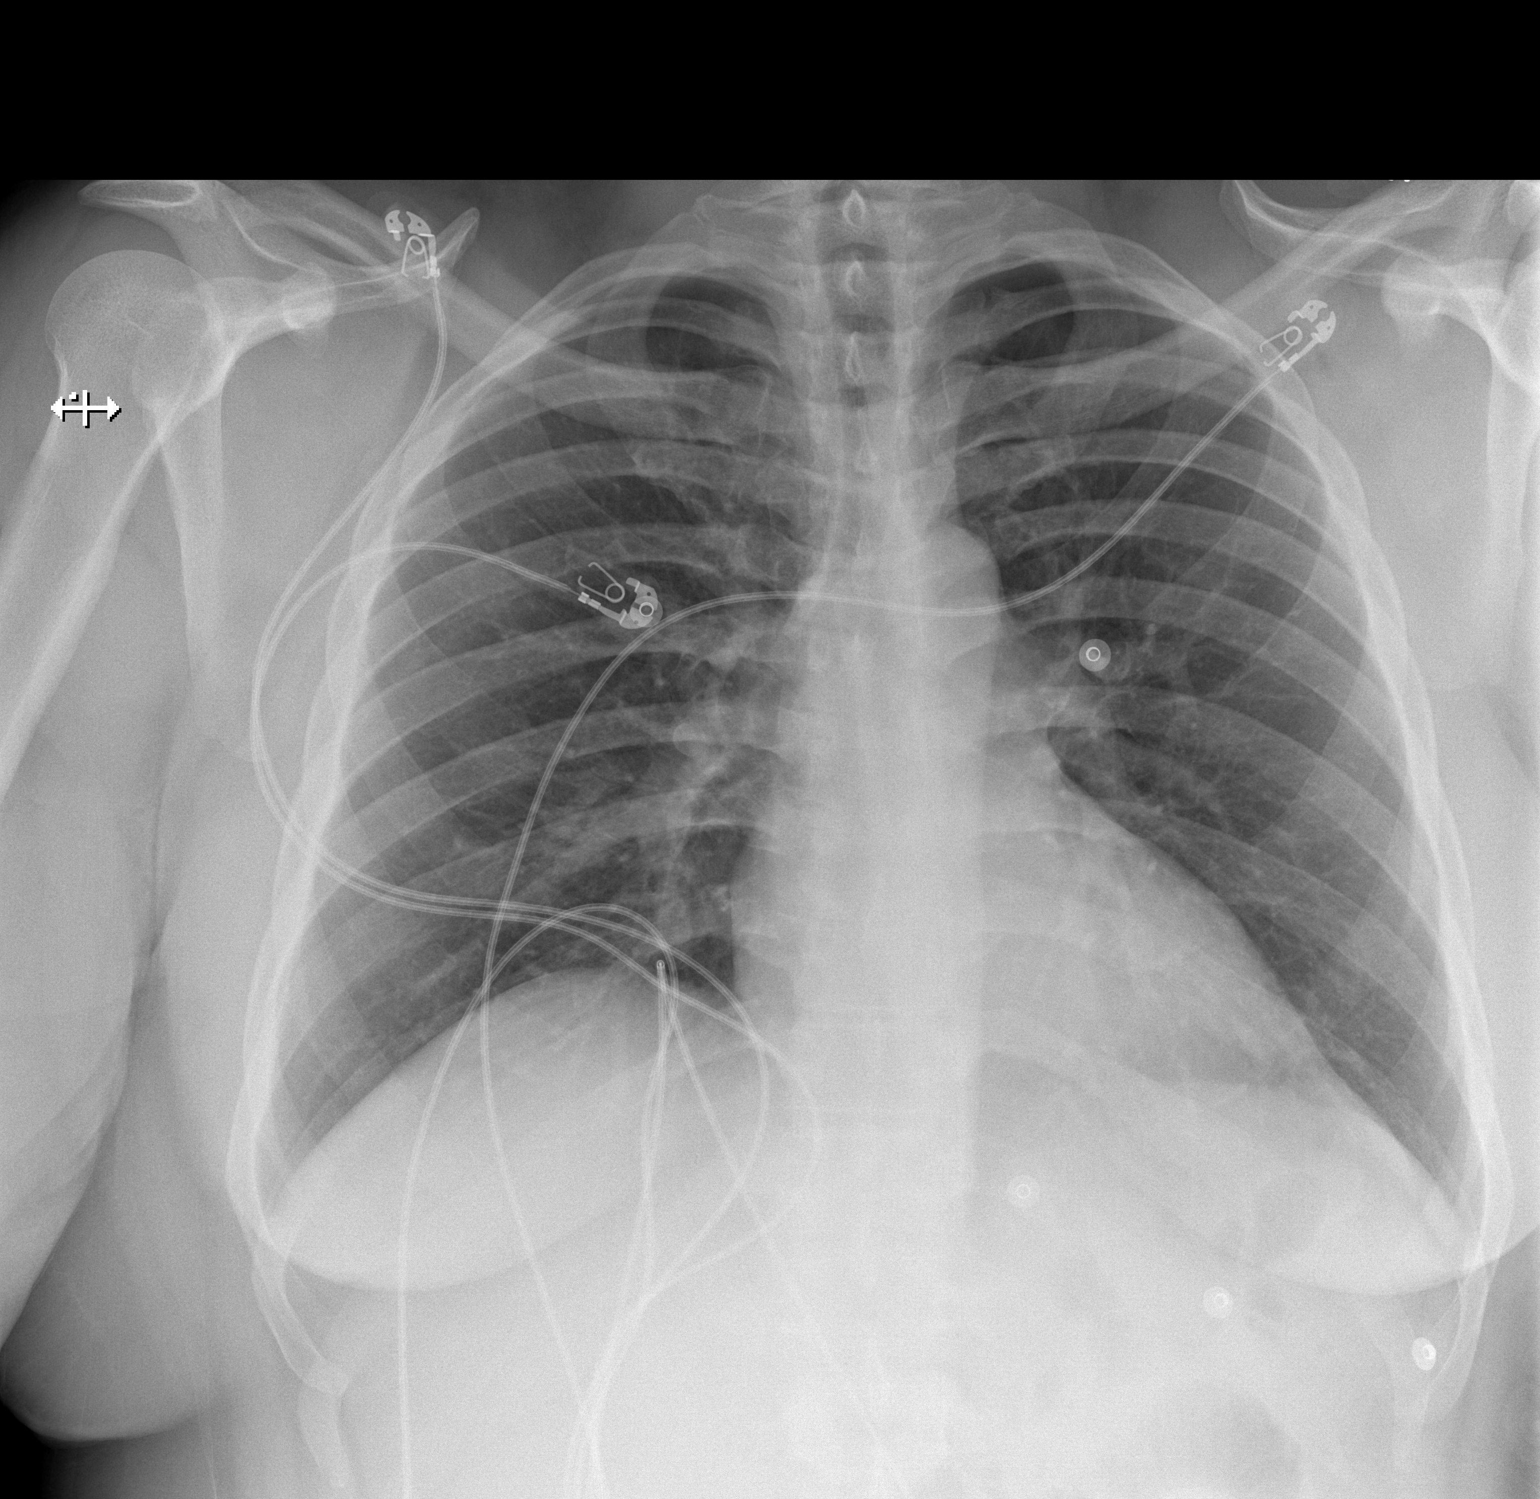

[w chest lat]
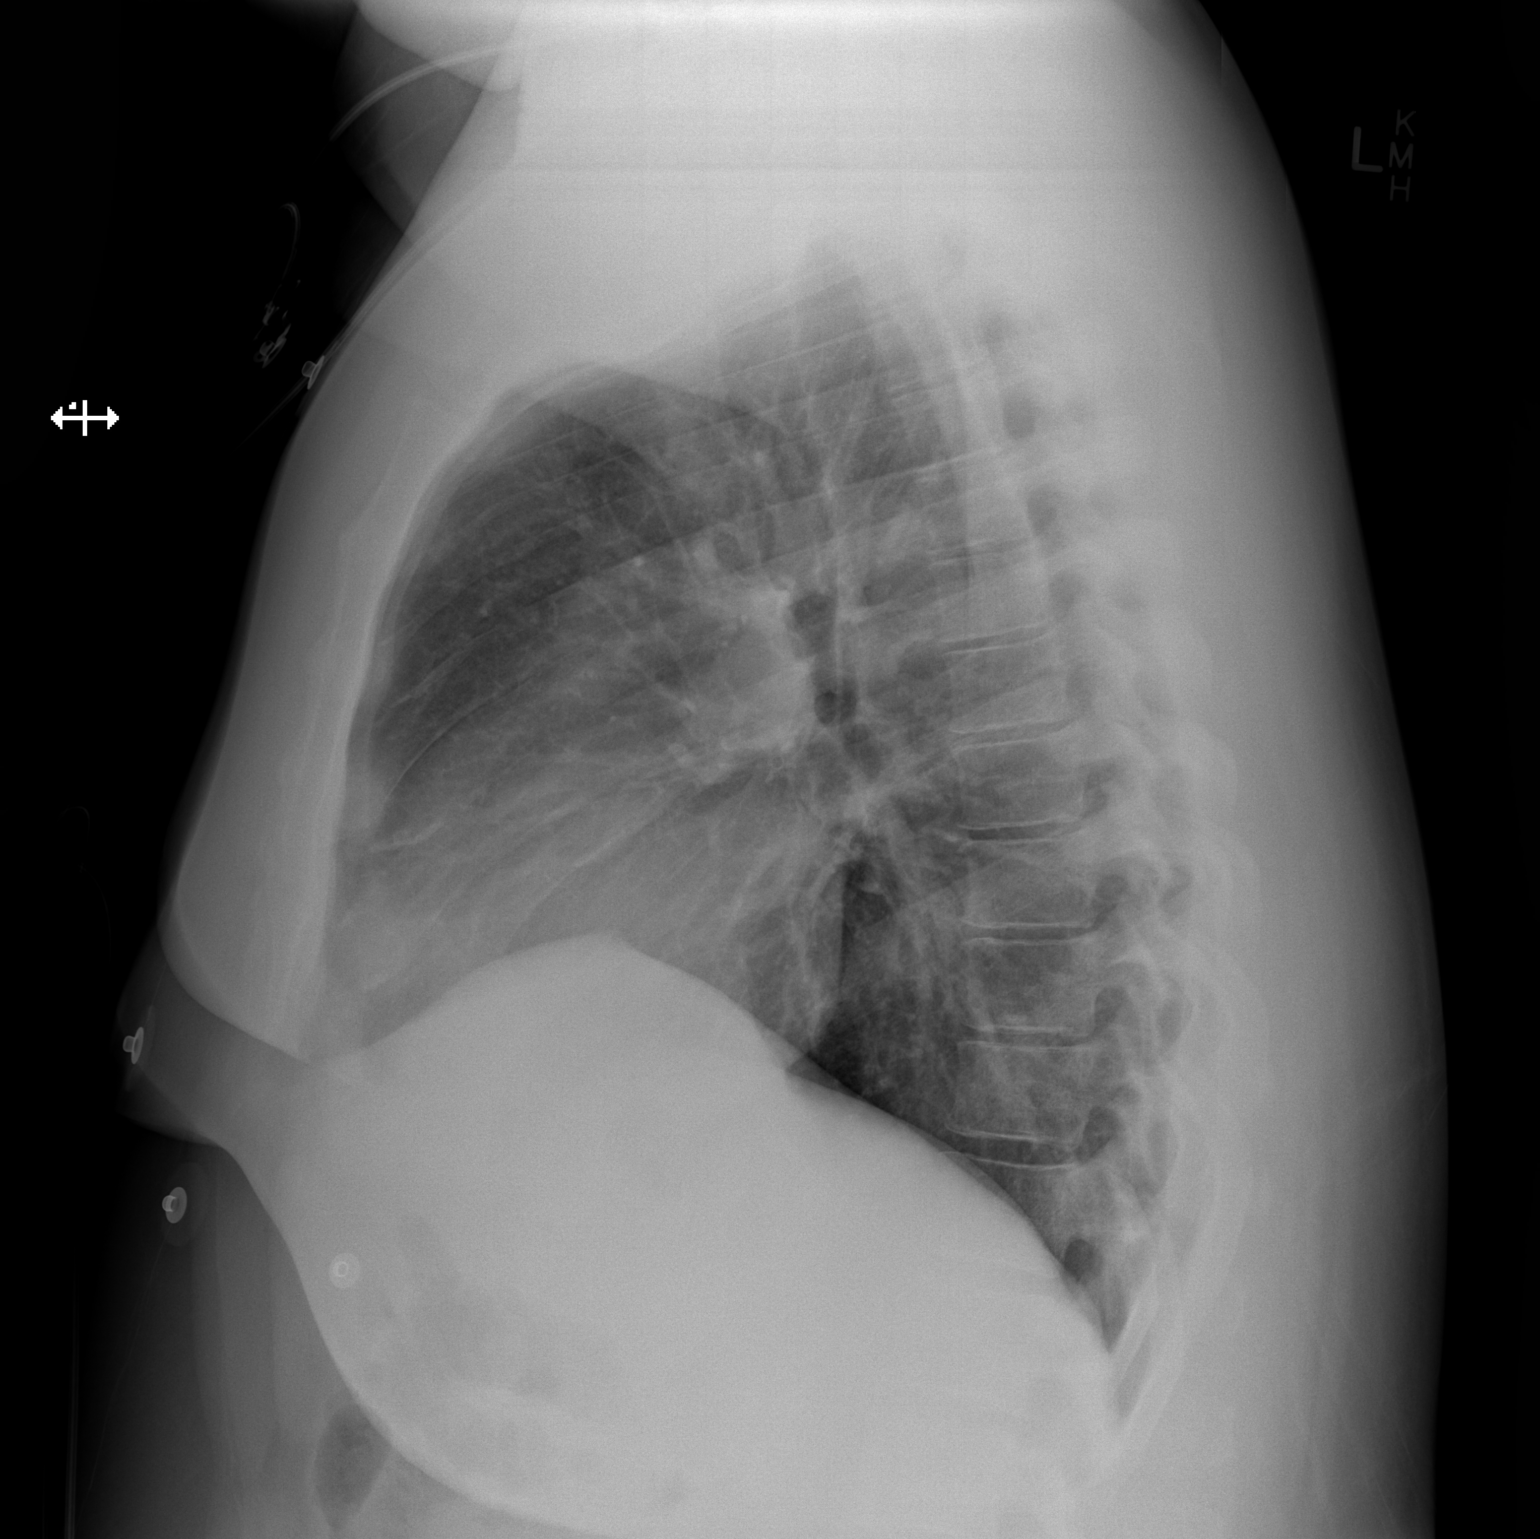

[2 of 2 positions shown; findings below may reference images not displayed]

FINDINGS: Stable heart size and mediastinal contours are within normal limits.
Both lungs are clear. The visualized skeletal structures are
unremarkable.
IMPRESSION: No active cardiopulmonary disease.

By: Tesfagergish Docter M.D.

## 2021-01-06 ENCOUNTER — Other Ambulatory Visit: Payer: Self-pay | Admitting: Internal Medicine

## 2021-01-11 LAB — PROTEIN, TOTAL AND ELECTRO, 24 HR U
Creatinine, 24H Ur: 2.55 g/(24.h) — ABNORMAL HIGH (ref 0.50–2.15)
PROTEIN/CREATININE RATIO: 0.063 mg/mg creat (ref <0.100)
PROTEIN/CREATININE RATIO: 63 mg/g creat (ref <100)
Protein, 24H Urine: 160 mg/24 h — ABNORMAL HIGH (ref 0–149)

## 2021-04-12 ENCOUNTER — Other Ambulatory Visit: Payer: Self-pay | Admitting: Internal Medicine

## 2021-04-13 LAB — COMPLETE METABOLIC PANEL WITH GFR
AG Ratio: 1.7 (calc) (ref 1.0–2.5)
ALT: 26 U/L (ref 9–46)
AST: 25 U/L (ref 10–40)
Albumin: 4.5 g/dL (ref 3.6–5.1)
Alkaline phosphatase (APISO): 71 U/L (ref 36–130)
BUN: 11 mg/dL (ref 7–25)
CO2: 34 mmol/L — ABNORMAL HIGH (ref 20–32)
Calcium: 9.3 mg/dL (ref 8.6–10.3)
Chloride: 95 mmol/L — ABNORMAL LOW (ref 98–110)
Creat: 1.12 mg/dL (ref 0.60–1.29)
Globulin: 2.6 g/dL (calc) (ref 1.9–3.7)
Glucose, Bld: 50 mg/dL — ABNORMAL LOW (ref 65–99)
Potassium: 3.4 mmol/L — ABNORMAL LOW (ref 3.5–5.3)
Sodium: 139 mmol/L (ref 135–146)
Total Bilirubin: 0.9 mg/dL (ref 0.2–1.2)
Total Protein: 7.1 g/dL (ref 6.1–8.1)
eGFR: 84 mL/min/{1.73_m2} (ref >=60)

## 2021-04-13 LAB — CBC
HCT: 41.2 % (ref 38.5–50.0)
Hemoglobin: 13.7 g/dL (ref 13.2–17.1)
MCH: 27.7 pg (ref 27.0–33.0)
MCHC: 33.3 g/dL (ref 32.0–36.0)
MCV: 83.4 fL (ref 80.0–100.0)
MPV: 9.9 fL (ref 7.5–12.5)
Platelets: 223 10*3/uL (ref 140–400)
RBC: 4.94 10*6/uL (ref 4.20–5.80)
RDW: 13.3 % (ref 11.0–15.0)
WBC: 2.5 10*3/uL — ABNORMAL LOW (ref 3.8–10.8)

## 2021-04-13 LAB — LIPID PANEL
Cholesterol: 152 mg/dL (ref <200)
HDL: 44 mg/dL (ref >=40)
LDL Cholesterol (Calc): 90 mg/dL (calc)
Non-HDL Cholesterol (Calc): 108 mg/dL (calc) (ref <130)
Total CHOL/HDL Ratio: 3.5 (calc) (ref <5.0)
Triglycerides: 88 mg/dL (ref <150)

## 2021-04-13 LAB — VITAMIN D 25 HYDROXY (VIT D DEFICIENCY, FRACTURES): Vit D, 25-Hydroxy: 75 ng/mL (ref 30–100)

## 2021-04-13 LAB — PSA: PSA: 0.57 ng/mL (ref <=4.00)

## 2021-08-13 ENCOUNTER — Emergency Department (HOSPITAL_COMMUNITY)
Admission: EM | Admit: 2021-08-13 | Discharge: 2021-08-13 | Disposition: A | Discharge status: Home / Self Care | Payer: BC Managed Care – PPO | Attending: Emergency Medicine | Admitting: Emergency Medicine

## 2021-08-13 ENCOUNTER — Other Ambulatory Visit: Payer: Self-pay

## 2021-08-13 ENCOUNTER — Encounter (HOSPITAL_COMMUNITY): Payer: Self-pay | Admitting: Emergency Medicine

## 2021-08-13 DIAGNOSIS — J029 Acute pharyngitis, unspecified: Secondary | ICD-10-CM | POA: Insufficient documentation

## 2021-08-13 LAB — GROUP A STREP BY PCR: Group A Strep by PCR: NOT DETECTED

## 2021-08-13 MED ORDER — PREDNISONE 20 MG PO TABS
60.0000 mg | ORAL_TABLET | Freq: Once | ORAL | Status: AC
  Administered 2021-08-13: 60 mg via ORAL
  Filled 2021-08-13: qty 3

## 2021-08-13 MED ORDER — PREDNISONE 20 MG PO TABS: 60.0000 mg | ORAL_TABLET | Freq: Every day | ORAL | 0 refills | Status: AC

## 2021-08-13 NOTE — ED Triage Notes (Signed)
Pt noted throat swelling since Friday night and has progressively gotten worse. This morning when waking up he felt as though his throat was closed and had a muffled voice. Pt does have a nut allergy and did eat some cookies. Pt tried benadryl x2 today. Pt reporting difficulty swallowing. Spo2-100% on RA. Pt not appearing in distress at this time.  ?

## 2021-08-13 NOTE — ED Provider Notes (Signed)
?[facility] EMERGENCY DEPARTMENT ?Provider Note ? ? ?CSN: [id number] ?Arrival date & time: 08/13/21  1432 ? ?  ? ?History ? ?Chief Complaint  ?Patient presents with  ? Sore Throat  ? ? ?Aaron Lindsey is a 41 y.o. male. ? ?The history is provided by the patient.  ?Sore Throat ?This is a new problem. The current episode started yesterday. The problem occurs constantly. The problem has not changed since onset.Pertinent negatives include no chest pain, no abdominal pain, no headaches and no shortness of breath. Nothing aggravates the symptoms. Nothing relieves the symptoms. He has tried nothing for the symptoms. The treatment provided no relief.  ? ?  ? ?Home Medications ?Prior to Admission medications   ?Medication Sig Start Date End Date Taking? Authorizing Provider  ?predniSONE (DELTASONE) 20 MG tablet Take 3 tablets (60 mg total) by mouth daily for 3 days. 08/13/21 08/16/21 Yes Saifan Rayford, DO  ?naproxen (NAPROSYN) 500 MG tablet Take 1 tablet (500 mg total) by mouth 2 (two) times daily. ?Patient not taking: Reported on 11/28/2016 09/07/16   Dione Booze, MD  ?orphenadrine (NORFLEX) 100 MG tablet Take 1 tablet (100 mg total) by mouth 2 (two) times daily. ?Patient not taking: Reported on 11/28/2016 09/07/16   Dione Booze, MD  ?oxyCODONE-acetaminophen (PERCOCET) 10-325 MG tablet Take 1 tablet by mouth every 6 (six) hours as needed for pain.    [provider]  ?   ? ?Allergies    ?Patient has no known allergies.   ? ?Review of Systems   ?Review of Systems  ?Respiratory:  Negative for shortness of breath.   ?Cardiovascular:  Negative for chest pain.  ?Gastrointestinal:  Negative for abdominal pain.  ?Neurological:  Negative for headaches.  ? ?Physical Exam ?Updated Vital Signs ?BP (!) 154/98   Pulse 93   Temp 98.8 ?F (37.1 ?C)   Resp 18   Ht 5\' 9"  (1.753 m)   Wt 86.2 kg   SpO2 100%   BMI 28.06 kg/m?  ?Physical Exam ?Vitals and nursing note reviewed.  ?Constitutional:   ?   General: He is  not in acute distress. ?   Appearance: He is well-developed.  ?HENT:  ?   Head: Normocephalic and atraumatic.  ?   Nose: No congestion.  ?   Mouth/Throat:  ?   Mouth: Mucous membranes are moist.  ?   Pharynx: Uvula midline. Posterior oropharyngeal erythema present. No pharyngeal swelling or oropharyngeal exudate.  ?   Tonsils: No tonsillar exudate or tonsillar abscesses.  ?Eyes:  ?   Conjunctiva/sclera: Conjunctivae normal.  ?Cardiovascular:  ?   Rate and Rhythm: Normal rate and regular rhythm.  ?   Heart sounds: No murmur heard. ?Pulmonary:  ?   Effort: Pulmonary effort is normal. No respiratory distress.  ?   Breath sounds: Normal breath sounds.  ?Abdominal:  ?   Palpations: Abdomen is soft.  ?   Tenderness: There is no abdominal tenderness.  ?Musculoskeletal:     ?   General: No swelling.  ?   Cervical back: Normal range of motion and neck supple.  ?Lymphadenopathy:  ?   Cervical: No cervical adenopathy.  ?Skin: ?   General: Skin is warm and dry.  ?   Capillary Refill: Capillary refill takes less than 2 seconds.  ?Neurological:  ?   Mental Status: He is alert.  ?Psychiatric:     ?   Mood and Affect: Mood normal.  ? ? ?ED Results / Procedures / Treatments   ?  Labs ?(all labs ordered are listed, but only abnormal results are displayed) ?Labs Reviewed  ?GROUP A STREP BY PCR  ? ? ?EKG ?None ? ?Radiology ?No results found. ? ?Procedures ?Procedures  ? ? ?Medications Ordered in ED ?Medications  ?predniSONE (DELTASONE) tablet 60 mg (has no administration in time range)  ? ? ?ED Course/ Medical Decision Making/ A&P ?  ?                        ?Medical Decision Making ?Risk ?Prescription drug management. ? ? ?Aaron Lindsey is here with sore throat.  Ongoing for the last several days.  Denies any difficulty swallowing.  Normal vitals.  No fever.  History of seasonal allergies.  May be allergy to eating nuts.  Ate some cookies.  Has tried Benadryl today.  He has no wheezing or hives or any signs of respiratory distress.   There is no obvious swelling to his tongue lips or mouth.  I suspect that this is likely a seasonal allergy related process or viral process.  Will check for strep throat.  Will prescribe steroids.  Recommend continued use of Benadryl.  No concern for deep space infection at this time.  Discharged in good condition.  Understands return precautions.  Given a dose of prednisone here.  Will prescribe prednisone for the next several days. ? ?This chart was dictated using voice recognition software.  Despite best efforts to proofread,  errors can occur which can change the documentation meaning.  ? ? ? ? ? ? ? ?Final Clinical Impression(s) / ED Diagnoses ?Final diagnoses:  ?Sore throat  ? ? ?Rx / DC Orders ?ED Discharge Orders   ? ?      Ordered  ?  predniSONE (DELTASONE) 20 MG tablet  Daily       ? 08/13/21 1519  ? ?  ?  ? ?  ? ? ?  ?Virgina Norfolk, DO ?08/13/21 1523 ? ?

## 2021-08-13 NOTE — Discharge Instructions (Signed)
Follow-up your strep test on your MyChart.  If your strep test is positive I will send an antibiotic to your pharmacy.  I have sent in steroids for you to continue to take starting tomorrow.  Overall suspect that this is an allergy process.  Less likely that this is viral.  Should improve with steroids.  Can continue to take Benadryl as needed as well. ?

## 2022-10-19 ENCOUNTER — Encounter (HOSPITAL_COMMUNITY): Payer: Self-pay

## 2022-10-19 ENCOUNTER — Ambulatory Visit (HOSPITAL_COMMUNITY)
Admission: EM | Admit: 2022-10-19 | Discharge: 2022-10-19 | Disposition: A | Discharge status: Home / Self Care | Payer: BC Managed Care – PPO | Attending: Physician Assistant | Admitting: Physician Assistant

## 2022-10-19 DIAGNOSIS — Z113 Encounter for screening for infections with a predominantly sexual mode of transmission: Secondary | ICD-10-CM

## 2022-10-19 DIAGNOSIS — Z202 Contact with and (suspected) exposure to infections with a predominantly sexual mode of transmission: Secondary | ICD-10-CM | POA: Diagnosis not present

## 2022-10-19 LAB — HEPATITIS C ANTIBODY: HCV Ab: NONREACTIVE

## 2022-10-19 LAB — HIV ANTIBODY (ROUTINE TESTING W REFLEX): HIV Screen 4th Generation wRfx: NONREACTIVE

## 2022-10-19 NOTE — Discharge Instructions (Signed)
Monitor MyChart for your results.  We will contact you if anything is positive.  Please abstain from sex and to receive your results.  I do recommend you return in 1 month, 3 months, 6 months for repeat HIV testing.  If you develop any symptoms please return for reevaluation.

## 2022-10-19 NOTE — ED Provider Notes (Signed)
[facility]    CSN: [id number] Arrival date & time: 10/19/22  1914      History   Chief Complaint Chief Complaint  Patient presents with   STD screening    HPI Aaron Lindsey is a 42 y.o. male.   Patient presents today for STI screening.  He denies any symptoms including penile discharge, genital lesion, urinary frequency, urgency, dysuria.  He is sexually active with male partners and typically does use condoms.  He is concerned because he recently found out that a person he has been intermittently engaged with for several years is HIV positive.  He is unsure if they are compliant with antiviral therapy and undetectable.  He denies any flulike symptoms or additional symptoms.  He is interested in complete STI panel today.    Past Medical History:  Diagnosis Date   Back pain    Herpes genitalis in men     There are no problems to display for this patient.   Past Surgical History:  Procedure Laterality Date   WISDOM TOOTH EXTRACTION Bilateral        Home Medications    Prior to Admission medications   Medication Sig Start Date End Date Taking? Authorizing Provider  meloxicam (MOBIC) 7.5 MG tablet Take 7.5 mg by mouth daily.   Yes [provider]  oxyCODONE-acetaminophen (PERCOCET/ROXICET) 5-325 MG tablet Take 1 tablet by mouth every 8 (eight) hours as needed for severe pain.   Yes [provider]  sildenafil (REVATIO) 20 MG tablet Take 60 mg by mouth as needed.   Yes [provider]    Family History Family History  Problem Relation Age of Onset   Stroke Mother    Heart failure Father     Social History Social History   Tobacco Use   Smoking status: Never   Smokeless tobacco: Never  Vaping Use   Vaping status: Never Used  Substance Use Topics   Alcohol use: Yes    Comment: occasionally   Drug use: No     Allergies   Patient has no known allergies.   Review of Systems Review of Systems   Constitutional:  Negative for activity change, appetite change, fatigue and fever.  Gastrointestinal:  Negative for abdominal pain, diarrhea, nausea and vomiting.  Genitourinary:  Negative for dysuria, frequency, genital sores, penile discharge and urgency.  Musculoskeletal:  Negative for arthralgias and myalgias.     Physical Exam Triage Vital Signs ED Triage Vitals [10/19/22 0951]  Encounter Vitals Group     BP (!) 139/94     Systolic BP Percentile      Diastolic BP Percentile      Pulse Rate 82     Resp 16     Temp 97.9 F (36.6 C)     Temp Source Oral     SpO2 99 %     Weight 198 lb (89.8 kg)     Height 5\' 10"  (1.778 m)     Head Circumference      Peak Flow      Pain Score 0     Pain Loc      Pain Education      Exclude from Growth Chart    No data found.  Updated Vital Signs BP (!) 139/94 (BP Location: Right Arm)   Pulse 82   Temp 97.9 F (36.6 C) (Oral)   Resp 16   Ht 5\' 10"  (1.778 m)   Wt 198 lb (89.8 kg)   SpO2  99%   BMI 28.41 kg/m   Visual Acuity Right Eye Distance:   Left Eye Distance:   Bilateral Distance:    Right Eye Near:   Left Eye Near:    Bilateral Near:     Physical Exam Vitals reviewed.  Constitutional:      General: He is awake.     Appearance: Normal appearance. He is well-developed. He is not ill-appearing.     Comments: Very pleasant male appears stated age in no acute distress sitting comfortably in exam room  HENT:     Head: Normocephalic and atraumatic.     Mouth/Throat:     Pharynx: Uvula midline. No oropharyngeal exudate, posterior oropharyngeal erythema or uvula swelling.  Cardiovascular:     Rate and Rhythm: Normal rate and regular rhythm.     Heart sounds: Normal heart sounds, S1 normal and S2 normal. No murmur heard. Pulmonary:     Effort: Pulmonary effort is normal.     Breath sounds: Normal breath sounds. No stridor. No wheezing, rhonchi or rales.     Comments: Clear to auscultation bilaterally Abdominal:      General: Bowel sounds are normal.     Palpations: Abdomen is soft.     Tenderness: There is no abdominal tenderness. There is no right CVA tenderness, left CVA tenderness, guarding or rebound.     Comments: Benign abdominal exam  Neurological:     Mental Status: He is alert.  Psychiatric:        Behavior: Behavior is cooperative.      UC Treatments / Results  Labs (all labs ordered are listed, but only abnormal results are displayed) Labs Reviewed  RPR  HIV ANTIBODY (ROUTINE TESTING W REFLEX)  HEPATITIS C ANTIBODY  CYTOLOGY, (ORAL, ANAL, URETHRAL) ANCILLARY ONLY    EKG   Radiology No results found.  Procedures Procedures (including critical care time)  Medications Ordered in UC Medications - No data to display  Initial Impression / Assessment and Plan / UC Course  I have reviewed the triage vital signs and the nursing notes.  Pertinent labs & imaging results that were available during my care of the patient were reviewed by me and considered in my medical decision making (see chart for details).     Patient is well-appearing, afebrile, nontoxic, nontachycardic.  He denies any symptoms.  Complete STI panel was obtained including HIV, hepatitis, syphilis.  We discussed that given concern for exposure to HIV he should return in 4 weeks, 3 months, 6 weeks for repeat testing.  We will contact him if anything is positive.  He is to abstain from sex until he receives results.  Discussed the importance of safe sex practices.  All questions were answered to patient satisfaction.  Final Clinical Impressions(s) / UC Diagnoses   Final diagnoses:  Screening examination for STI     Discharge Instructions      Monitor MyChart for your results.  We will contact you if anything is positive.  Please abstain from sex and to receive your results.  I do recommend you return in 1 month, 3 months, 6 months for repeat HIV testing.  If you develop any symptoms please return for  reevaluation.     ED Prescriptions   None    PDMP not reviewed this encounter.   Jeani Hawking, PA-C 10/19/22 1024

## 2022-10-19 NOTE — ED Triage Notes (Signed)
Patient here today with c/o of STDs. He recently found out from a friend that he had intercourse with someone that had HIV. Patient states that the this person new that they were positive and did not tell him. Patient would like to be tested for all STDs.

## 2022-10-20 LAB — RPR: RPR Ser Ql: NONREACTIVE

## 2022-10-22 LAB — CYTOLOGY, (ORAL, ANAL, URETHRAL) ANCILLARY ONLY
Chlamydia: NEGATIVE
Comment: NEGATIVE
Comment: NEGATIVE
Comment: NORMAL
Neisseria Gonorrhea: NEGATIVE
Trichomonas: NEGATIVE

## 2022-12-09 ENCOUNTER — Encounter (HOSPITAL_COMMUNITY): Payer: Self-pay | Admitting: Emergency Medicine

## 2022-12-09 ENCOUNTER — Ambulatory Visit (HOSPITAL_COMMUNITY)
Admission: EM | Admit: 2022-12-09 | Discharge: 2022-12-09 | Disposition: A | Discharge status: Home / Self Care | Payer: BC Managed Care – PPO | Attending: Nurse Practitioner | Admitting: Nurse Practitioner

## 2022-12-09 DIAGNOSIS — Z113 Encounter for screening for infections with a predominantly sexual mode of transmission: Secondary | ICD-10-CM | POA: Diagnosis present

## 2022-12-09 DIAGNOSIS — Z202 Contact with and (suspected) exposure to infections with a predominantly sexual mode of transmission: Secondary | ICD-10-CM | POA: Insufficient documentation

## 2022-12-09 LAB — HIV ANTIBODY (ROUTINE TESTING W REFLEX): HIV Screen 4th Generation wRfx: NONREACTIVE

## 2022-12-09 NOTE — ED Provider Notes (Signed)
[facility]    CSN: [id number] Arrival date & time: 12/09/22  1005      History   Chief Complaint Chief Complaint  Patient presents with   Exposure to STD    HPI Aaron Lindsey is a 42 y.o. male.   Patient presents today with chief complaint follow-up.  Reports he is requesting HIV testing only.  Denies recent exposures to any STIs that he knows of.  Reports he was seen back in July and was told to come in for repeat testing.  He denies any symptoms including dysuria, urinary frequency or urgency, groin swelling, abdominal pain, fever, nausea/vomiting, penile discharge.  No rashes, sores, or lesions on his penis or testicles.  He declines being tested for anything else today.    Past Medical History:  Diagnosis Date   Back pain    Herpes genitalis in men     There are no problems to display for this patient.   Past Surgical History:  Procedure Laterality Date   WISDOM TOOTH EXTRACTION Bilateral        Home Medications    Prior to Admission medications   Medication Sig Start Date End Date Taking? Authorizing Provider  meloxicam (MOBIC) 7.5 MG tablet Take 7.5 mg by mouth daily.    [provider]  oxyCODONE-acetaminophen (PERCOCET/ROXICET) 5-325 MG tablet Take 1 tablet by mouth every 8 (eight) hours as needed for severe pain.    [provider]  sildenafil (REVATIO) 20 MG tablet Take 60 mg by mouth as needed.    [provider]    Family History Family History  Problem Relation Age of Onset   Stroke Mother    Heart failure Father     Social History Social History   Tobacco Use   Smoking status: Never   Smokeless tobacco: Never  Vaping Use   Vaping status: Never Used  Substance Use Topics   Alcohol use: Yes    Comment: occasionally   Drug use: No     Allergies   Patient has no known allergies.   Review of Systems Review of Systems Per HPI  Physical Exam Triage Vital Signs ED Triage Vitals [12/09/22  1036]  Encounter Vitals Group     BP 124/78     Systolic BP Percentile      Diastolic BP Percentile      Pulse Rate 78     Resp 15     Temp 98.4 F (36.9 C)     Temp Source Oral     SpO2 98 %     Weight      Height      Head Circumference      Peak Flow      Pain Score 0     Pain Loc      Pain Education      Exclude from Growth Chart    No data found.  Updated Vital Signs BP 124/78 (BP Location: Left Arm)   Pulse 78   Temp 98.4 F (36.9 C) (Oral)   Resp 15   SpO2 98%   Visual Acuity Right Eye Distance:   Left Eye Distance:   Bilateral Distance:    Right Eye Near:   Left Eye Near:    Bilateral Near:     Physical Exam Vitals and nursing note reviewed.  Constitutional:      General: He is not in acute distress.    Appearance: Normal appearance. He is not toxic-appearing.  Pulmonary:  Effort: Pulmonary effort is normal. No respiratory distress.  Skin:    General: Skin is warm and dry.     Capillary Refill: Capillary refill takes less than 2 seconds.     Coloration: Skin is not jaundiced or pale.  Neurological:     Mental Status: He is alert and oriented to person, place, and time.     Motor: No weakness.     Gait: Gait normal.  Psychiatric:        Behavior: Behavior is cooperative.      UC Treatments / Results  Labs (all labs ordered are listed, but only abnormal results are displayed) Labs Reviewed  HIV ANTIBODY (ROUTINE TESTING W REFLEX)    EKG   Radiology No results found.  Procedures Procedures (including critical care time)  Medications Ordered in UC Medications - No data to display  Initial Impression / Assessment and Plan / UC Course  I have reviewed the triage vital signs and the nursing notes.  Pertinent labs & imaging results that were available during my care of the patient were reviewed by me and considered in my medical decision making (see chart for details).   Patient is well-appearing, normotensive, afebrile, not  tachycardic, not tachypneic, oxygenating well on room air.    1. Screening for STD (sexually transmitted disease) 2. Possible exposure to STD HIV testing obtained per patient's request Condoms given today; safe sex practices recommended Patient to return in 2 months and 5 months for repeat testing as previously instructed  The patient was given the opportunity to ask questions.  All questions answered to their satisfaction.  The patient is in agreement to this plan.    Final Clinical Impressions(s) / UC Diagnoses   Final diagnoses:  Screening for STD (sexually transmitted disease)  Possible exposure to STD     Discharge Instructions      We are testing today for HIV.  You will see the results in MyChart and we will contact you with abnormal results.  Recommend condom use with every sexual counter moving forward.     ED Prescriptions   None    PDMP not reviewed this encounter.   Valentino Nose, NP 12/09/22 1114

## 2022-12-09 NOTE — ED Triage Notes (Signed)
Pt reports was exposed to HIV. Was seen here in July and told to come back for repeat testing. Denies any s/s.

## 2022-12-09 NOTE — Discharge Instructions (Addendum)
We are testing today for HIV.  You will see the results in MyChart and we will contact you with abnormal results.  Recommend condom use with every sexual counter moving forward.

## 2023-08-13 ENCOUNTER — Observation Stay (HOSPITAL_COMMUNITY)
Admission: EM | Admit: 2023-08-13 | Discharge: 2023-08-14 | Disposition: A | Discharge status: Home / Self Care | Attending: Family Medicine | Admitting: Family Medicine

## 2023-08-13 ENCOUNTER — Emergency Department (HOSPITAL_COMMUNITY)

## 2023-08-13 ENCOUNTER — Encounter (HOSPITAL_COMMUNITY): Payer: Self-pay | Admitting: Emergency Medicine

## 2023-08-13 DIAGNOSIS — D649 Anemia, unspecified: Secondary | ICD-10-CM | POA: Insufficient documentation

## 2023-08-13 DIAGNOSIS — Z789 Other specified health status: Secondary | ICD-10-CM

## 2023-08-13 DIAGNOSIS — F1090 Alcohol use, unspecified, uncomplicated: Secondary | ICD-10-CM | POA: Diagnosis not present

## 2023-08-13 DIAGNOSIS — G8929 Other chronic pain: Secondary | ICD-10-CM | POA: Insufficient documentation

## 2023-08-13 DIAGNOSIS — F1010 Alcohol abuse, uncomplicated: Secondary | ICD-10-CM | POA: Diagnosis not present

## 2023-08-13 DIAGNOSIS — R55 Syncope and collapse: Secondary | ICD-10-CM | POA: Diagnosis present

## 2023-08-13 LAB — TROPONIN I (HIGH SENSITIVITY)
Troponin I (High Sensitivity): 3 ng/L (ref <18)
Troponin I (High Sensitivity): <2 ng/L (ref <18)

## 2023-08-13 LAB — BASIC METABOLIC PANEL WITH GFR
Anion gap: 12 (ref 5–15)
BUN: 12 mg/dL (ref 6–20)
CO2: 23 mmol/L (ref 22–32)
Calcium: 8.9 mg/dL (ref 8.9–10.3)
Chloride: 104 mmol/L (ref 98–111)
Creatinine, Ser: 1.12 mg/dL (ref 0.61–1.24)
GFR, Estimated: >60 mL/min (ref >60)
Glucose, Bld: 188 mg/dL — ABNORMAL HIGH (ref 70–99)
Potassium: 3.7 mmol/L (ref 3.5–5.1)
Sodium: 139 mmol/L (ref 135–145)

## 2023-08-13 LAB — MAGNESIUM: Magnesium: 1.9 mg/dL (ref 1.7–2.4)

## 2023-08-13 LAB — RAPID URINE DRUG SCREEN, HOSP PERFORMED
Amphetamines: NOT DETECTED
Barbiturates: NOT DETECTED
Benzodiazepines: NOT DETECTED
Cocaine: NOT DETECTED
Opiates: NOT DETECTED
Tetrahydrocannabinol: POSITIVE — AB

## 2023-08-13 LAB — IRON AND TIBC
Iron: 70 ug/dL (ref 45–182)
Saturation Ratios: 24 % (ref 17.9–39.5)
TIBC: 294 ug/dL (ref 250–450)
UIBC: 224 ug/dL

## 2023-08-13 LAB — ETHANOL: Alcohol, Ethyl (B): <15 mg/dL (ref <15)

## 2023-08-13 LAB — LIPID PANEL
Cholesterol: 147 mg/dL (ref 0–200)
HDL: 42 mg/dL (ref >40)
LDL Cholesterol: 98 mg/dL (ref 0–99)
Total CHOL/HDL Ratio: 3.5 ratio
Triglycerides: 37 mg/dL (ref <150)
VLDL: 7 mg/dL (ref 0–40)

## 2023-08-13 LAB — URINALYSIS, ROUTINE W REFLEX MICROSCOPIC
Bacteria, UA: NONE SEEN
Glucose, UA: NEGATIVE mg/dL
Hgb urine dipstick: NEGATIVE
Ketones, ur: 5 mg/dL — AB
Leukocytes,Ua: NEGATIVE
Nitrite: NEGATIVE
Protein, ur: 100 mg/dL — AB
Specific Gravity, Urine: 1.028 (ref 1.005–1.030)
pH: 6 (ref 5.0–8.0)

## 2023-08-13 LAB — HEPATIC FUNCTION PANEL
ALT: 27 U/L (ref 0–44)
AST: 23 U/L (ref 15–41)
Albumin: 3.6 g/dL (ref 3.5–5.0)
Alkaline Phosphatase: 58 U/L (ref 38–126)
Bilirubin, Direct: 0.1 mg/dL (ref 0.0–0.2)
Indirect Bilirubin: 0.8 mg/dL (ref 0.3–0.9)
Total Bilirubin: 0.9 mg/dL (ref 0.0–1.2)
Total Protein: 6.3 g/dL — ABNORMAL LOW (ref 6.5–8.1)

## 2023-08-13 LAB — TSH: TSH: 0.182 u[IU]/mL — ABNORMAL LOW (ref 0.350–4.500)

## 2023-08-13 LAB — CBC
HCT: 38.9 % — ABNORMAL LOW (ref 39.0–52.0)
Hemoglobin: 12.6 g/dL — ABNORMAL LOW (ref 13.0–17.0)
MCH: 27.9 pg (ref 26.0–34.0)
MCHC: 32.4 g/dL (ref 30.0–36.0)
MCV: 86.1 fL (ref 80.0–100.0)
Platelets: 216 10*3/uL (ref 150–400)
RBC: 4.52 MIL/uL (ref 4.22–5.81)
RDW: 13.3 % (ref 11.5–15.5)
WBC: 4 10*3/uL (ref 4.0–10.5)
nRBC: 0 % (ref 0.0–0.2)

## 2023-08-13 LAB — HIV ANTIBODY (ROUTINE TESTING W REFLEX): HIV Screen 4th Generation wRfx: NONREACTIVE

## 2023-08-13 LAB — D-DIMER, QUANTITATIVE: D-Dimer, Quant: <0.27 ug{FEU}/mL (ref 0.00–0.50)

## 2023-08-13 LAB — RPR: RPR Ser Ql: NONREACTIVE

## 2023-08-13 LAB — FERRITIN: Ferritin: 24 ng/mL (ref 24–336)

## 2023-08-13 LAB — HEMOGLOBIN A1C
Hgb A1c MFr Bld: 5 % (ref 4.8–5.6)
Mean Plasma Glucose: 96.8 mg/dL

## 2023-08-13 LAB — CBG MONITORING, ED: Glucose-Capillary: 130 mg/dL — ABNORMAL HIGH (ref 70–99)

## 2023-08-13 MED ORDER — LACTATED RINGERS IV BOLUS
1000.0000 mL | Freq: Once | INTRAVENOUS | Status: AC
  Administered 2023-08-13: 1000 mL via INTRAVENOUS

## 2023-08-13 MED ORDER — FOLIC ACID 1 MG PO TABS
1.0000 mg | ORAL_TABLET | Freq: Every day | ORAL | Status: DC
  Administered 2023-08-13 – 2023-08-14 (×2): 1 mg via ORAL
  Filled 2023-08-13 (×2): qty 1

## 2023-08-13 MED ORDER — ADULT MULTIVITAMIN W/MINERALS CH
1.0000 | ORAL_TABLET | Freq: Every day | ORAL | Status: DC
  Administered 2023-08-13 – 2023-08-14 (×2): 1 via ORAL
  Filled 2023-08-13 (×2): qty 1

## 2023-08-13 MED ORDER — IBUPROFEN 200 MG PO TABS: 600.0000 mg | ORAL_TABLET | Freq: Four times a day (QID) | ORAL | Status: DC | PRN

## 2023-08-13 MED ORDER — ENOXAPARIN SODIUM 40 MG/0.4ML IJ SOSY
40.0000 mg | PREFILLED_SYRINGE | INTRAMUSCULAR | Status: DC
  Administered 2023-08-13 – 2023-08-14 (×2): 40 mg via SUBCUTANEOUS
  Filled 2023-08-13 (×2): qty 0.4

## 2023-08-13 MED ORDER — THIAMINE HCL 100 MG/ML IJ SOLN: 100.0000 mg | Freq: Every day | INTRAMUSCULAR | Status: DC

## 2023-08-13 MED ORDER — ACETAMINOPHEN 325 MG PO TABS: 650.0000 mg | ORAL_TABLET | Freq: Four times a day (QID) | ORAL | Status: DC | PRN

## 2023-08-13 MED ORDER — THIAMINE MONONITRATE 100 MG PO TABS
100.0000 mg | ORAL_TABLET | Freq: Every day | ORAL | Status: DC
  Administered 2023-08-13 – 2023-08-14 (×2): 100 mg via ORAL
  Filled 2023-08-13 (×2): qty 1

## 2023-08-13 NOTE — Plan of Care (Signed)
 [facility] Brief Progress Note  S:Went bedside w/ Dr. Lansing Planas to see patient. Patient with no complaints, but notes chronic back pain, usually treated w/ Norco. Discussed we would start something for pain.    O: BP 112/68   Pulse 94   Temp 97.8 F (36.6 C)   Resp 13   Ht 5\' 10"  (1.778 m)   Wt 90 kg   SpO2 100%   BMI 28.47 kg/m   Gen: NAD, conversant Card: RRR, NRMG Resp: CTABL Abd: Soft, NTTP  A/P: Syncope Pt admitted for syncope, likely multifactorial, will also want to obtain Echo, given family hx of cardiac death. -Plans per day team   Back Pain Patient note's chronic back pain and request his Norco. Given the concern for polypharmacy, will first attempt to give Ibuprofen/Tylenol. If symptoms not treated, will consider escalation.  -Ibuprofen 600 mg q6h prn -Tylenol 650 mg q6h prn - Orders reviewed. Labs for AM ordered, which was adjusted as needed.   Wilhemena Harbour, MD 08/13/2023, 8:28 PM PGY-3, [facility] Night Resident  Please page [phone] with questions.

## 2023-08-13 NOTE — H&P (Signed)
 Hospital Admission History and Physical Service Pager: [phone]  Patient name: Aaron Lindsey Medical record number: [id number] Date of Birth: 1981/02/27 Age: 43 y.o. Gender: male  Primary Care Provider: Charle Congo, MD Consultants: None Code Status: FULL which was confirmed with family if patient unable to confirm   Preferred Emergency Contact:  Contact Information     Name Relation Home Work Mobile   [place] Spouse   [phone]      Other Contacts   None on File      Chief Complaint: Syncope  Assessment and Plan: Aaron Lindsey is a 43 y.o. male presenting with syncopal event.  Higher concern for: syncopal episodes include volume depletion(especially given amber urine, hyaline casts/ketnouria/hypotension on arrival), postural orthostasis, concomitant ingestion of alcohol/narcotics/phentermine, vasovagal response, possible cardiac disease (strong family history)  Minimal to no concern for: intracranial lesion, ACS (negative troponins), DKA or other metabolic disorder, PE (D-dimer negative). Assessment & Plan Syncope Likely secondary to constellation of: concomitant alcohol/phentermine/chronic opioid use. Strong fam hx cardiac disease- both brother (53 year old) and father deceased reportedly from heart disease; thus necessitating further workup - Volume resuscitation with IV fluids, s/p 2 L LR bolus; hopeful for improvement after fluid rescusitation - Risk stratification labs with TSH, A1c, HIV, lipid panel and RPR - PT/OT - Add TED hose if persistent orthostasis - Echocardiogram - reasonable given longstanding hx syncope, family hx - AM CMP Alcohol use - CIWA q6h without standing ativan order - Thiamine, Folate, Multivitamin - TOC resources for substance use Anemia Unknown etiology, but in the setting of chronic intermittent abdominal pain. No overt signs of bleeding/melena/hematochezia/frank hematuria - Check iron, TIBC, ferritin - Consider blood  smear if downtrending - Trend w/ daily CBC - Should abdominal pain present/persist, consider CT abdomen/pelvis    Chronic and Stable Problems: Chronic pain: Reviewed PDMP, monthly fills for oxycodone-acetaminophen 5-3 25#90 tablets for 30 days - Continue home oxycodone-acetaminophen 5-325 TID  Obesity Phentermine 37.5 mg last filled 07/13/2023 - Hold phentermine in setting of syncope   FEN/GI: Regular diet VTE Prophylaxis: Lovenox  Disposition: Med-Surg  History of Present Illness:  Aaron Lindsey is a 43 y.o. male presenting with syncope.  Per chart review, patient has history of vasovagal episodes dating back to 2018.  This happened when he was exercising and using stimulant exercise supplements.  Wife at the bedside provides majority of the history.  She says at 4 AM she heard a "thud" and soft feet on the floor that she assumed was her daughters.  When she went to look further, she realized it was [employer] on the bathroom floor naked. He had drank a Smirnov alcoholic beverage last night.  He is on vacation this week, and has been drinking daily while on vacation.  Denies daily use in the past.  Denies illicit substance use.  Adalberto then stood up and tried to walk a few steps with her assistance, syncopized again.  He crawled to the bed and asked her to call 911. He was "talking crazy" per wife about "fairy dust" and could not remember his daughter's name prior to second syncopal event.  He works as a Investment banker, corporate, 5 days/week.  Denies any chest pain, shortness of breath. Has been complaining of intermittent abdominal pain which she has been seeing his PCP for.  Takes Pepto-Bismol for relief.  Denies melena, hematochezia, frank hematuria.  Worked in the yard a lot over the weekend, picking up heavy items.  The patient is alert and  able to tell me the year, location, reason for hospitalization.  In the ED, initially hypertensive to 90/49, with improvement s/p  2 L of IV fluids.  CT head without contrast, chest x-ray negative.  Admitted for further evaluation and management of syncope  Review Of Systems:  Intermittent abdominal pain Denies acute chest pain, shortness of breath, dizziness, lightheadedness.  Pertinent Past Medical History: Obesity Vasovagal episodes Vitamin D  deficiency Chronic pain Remainder reviewed in history tab.   Pertinent Past Surgical History: Repeat B/L lumbar RFA L4-5 and L5-S1 on 07/15/2023 with Dr Collene Dawson.    Past Surgical History:  Procedure Laterality Date   WISDOM TOOTH EXTRACTION Bilateral     Remainder reviewed in history tab.  Pertinent Social History: Tobacco use: Denies Alcohol use: Yes- daily this past week while on vacation Other Substance use: Denies Lives with wife of 18 years, daughters  Pertinent Family History: Father- CHF, heart disease (deceased) Brother- CHF, heart disease (deceased at 45 years old a few months ago)  Remainder reviewed in history tab.   Important Outpatient Medications: Oxycodone-acetaminophen 5-325 TID  Remainder reviewed in medication history.   Objective: BP 124/77   Pulse 89   Temp (!) 97.4 F (36.3 C) (Oral)   Resp 13   SpO2 100%  Exam: General: Well-nourished male laying in the bed in no distress, awakens to voice Eyes: Pupils PERRLA.  EOMI. ENTM: Tacky mucous membranes.  Fair dentition. Neck: No palpable LAD Cardiovascular: Regular rate and rhythm, no murmurs or other abnormal heart sounds appreciated Respiratory: Normal effort on room air with no wheezing or crackles Gastrointestinal: Soft, nontender and nondistended.  No rebound or guarding. MSK: No bony abnormalities visualized Derm: No rashes or lesions on extremities Neuro: A&O x 4.  Speech is clear and fluent.  He speaks with his eyes closed majority of the time, but will open his eyes and follow commands.  Grip strength 5 out of 5 bilaterally.  No muscle wasting in extremities. Psych:  Appropriate.  Not combative or agitated.  Labs:  CBC BMET  Recent Labs  Lab 08/13/23 0616  WBC 4.0  HGB 12.6*  HCT 38.9*  PLT 216   Recent Labs  Lab 08/13/23 0616  NA 139  K 3.7  CL 104  CO2 23  BUN 12  CREATININE 1.12  GLUCOSE 188*  CALCIUM 8.9     EKG: Normal sinus rhythm with no acute ST or T wave changes.  No evidence of right bundle branch block or left bundle branch block.  QTc 400 ms.   Imaging Studies Performed:  CT HEAD WO CONTRAST ( ) Result Date: 08/13/2023 CLINICAL DATA:  Mental status change with unknown cause EXAM: CT HEAD WITHOUT CONTRAST TECHNIQUE: Contiguous axial images were obtained from the base of the skull through the vertex without intravenous contrast. RADIATION DOSE REDUCTION: This exam was performed according to the departmental dose-optimization program which includes automated exposure control, adjustment of the mA and/or kV according to patient size and/or use of iterative reconstruction technique. COMPARISON:  None Available. FINDINGS: Brain: No evidence of acute infarction, hemorrhage, hydrocephalus, extra-axial collection or mass lesion/mass effect. Vascular: No hyperdense vessel or unexpected calcification. Skull: Normal. Negative for fracture or focal lesion. Sinuses/Orbits: No acute finding. IMPRESSION: Negative head CT. Electronically Signed   By: Ronnette Coke M.D.   On: 08/13/2023 07:58   DG Chest 2 View Result Date: 08/13/2023 CLINICAL DATA:  Syncope. EXAM: CHEST - 2 VIEW COMPARISON:  11/28/2016 FINDINGS: The heart size and mediastinal contours are  within normal limits. Both lungs are clear. The visualized skeletal structures are unremarkable. IMPRESSION: No active cardiopulmonary disease. Electronically Signed   By: Kimberley Penman M.D.   On: 08/13/2023 06:30      Billy Turvey, DO 08/13/2023, 10:33 AM PGY-3, [facility]  FPTS Intern pager: [phone], text pages welcome Secure chat group [facility]  Teaching Service

## 2023-08-13 NOTE — Assessment & Plan Note (Addendum)
 Unknown etiology, but in the setting of chronic intermittent abdominal pain. No overt signs of bleeding/melena/hematochezia/frank hematuria - Check iron, TIBC, ferritin - Consider blood smear if downtrending - Trend w/ daily CBC - Should abdominal pain present/persist, consider CT abdomen/pelvis

## 2023-08-13 NOTE — ED Provider Notes (Signed)
 [place] EMERGENCY DEPARTMENT AT [facility] Provider Note   CSN: [id number] Arrival date & time: 08/13/23  0539     History  Chief Complaint  Patient presents with   Loss of Consciousness    Aaron Lindsey is a 43 y.o. male.  This is a 43 year old male presenting emergency department for syncope.  Reportedly normal yesterday, had a screwdriver alcoholic beverage went to bed normal.  Was found on the floor by spouse around 4 AM.  She stood him up, he syncopized.  He does not recall preceding events.  Unsure if he hit his head.  He is alert and oriented x 3, but somewhat intoxicated.  He denied any pain, however on review of systems notes some chest pressure.  Wife notes he has complained of some palpitations intermittently over the past week or so.   Loss of Consciousness      Home Medications Prior to Admission medications   Medication Sig Start Date End Date Taking? Authorizing Provider  albuterol (VENTOLIN HFA) 108 (90 Base) MCG/ACT inhaler Inhale 2 puffs into the lungs every 4 (four) hours as needed for wheezing or shortness of breath.   Yes [provider]  lidocaine (XYLOCAINE) 5 % ointment Apply 1 Application topically 3 (three) times daily as needed for moderate pain (pain score 4-6). 07/29/23  Yes [provider]  meloxicam (MOBIC) 7.5 MG tablet Take 7.5 mg by mouth daily.   Yes [provider]  methocarbamol  (ROBAXIN ) 500 MG tablet Take 500 mg by mouth every 8 (eight) hours as needed for muscle spasms. 07/30/23  Yes [provider]  oxyCODONE-acetaminophen (PERCOCET/ROXICET) 5-325 MG tablet Take 1 tablet by mouth every 8 (eight) hours as needed for severe pain.   Yes [provider]  phentermine (ADIPEX-P) 37.5 MG tablet Take 37.5 mg by mouth every morning. 07/13/23  Yes [provider]  predniSONE  (STERAPRED UNI-PAK 21 TAB) 10 MG (21) TBPK tablet Take by mouth as directed. 07/30/23  Yes [provider]   sildenafil (REVATIO) 20 MG tablet Take 60 mg by mouth as needed (ED).   Yes [provider]  Vitamin D , Ergocalciferol , (DRISDOL) 1.25 MG (50000 UNIT) CAPS capsule Take 50,000 Units by mouth once a week. Patient not taking: Reported on 08/13/2023 05/21/23   [provider]      Allergies    Patient has no known allergies.    Review of Systems   Review of Systems  Cardiovascular:  Positive for syncope.    Physical Exam Updated Vital Signs BP 108/66 (BP Location: Right Arm)   Pulse 96   Temp 98 F (36.7 C)   Resp 16   Ht 5\' 10"  (1.778 m)   Wt 90 kg   SpO2 100%   BMI 28.47 kg/m  Physical Exam Vitals and nursing note reviewed.  Constitutional:      General: He is not in acute distress.    Appearance: He is not toxic-appearing.  HENT:     Head: Normocephalic and atraumatic.     Nose: Nose normal.  Eyes:     Conjunctiva/sclera: Conjunctivae normal.     Pupils: Pupils are equal, round, and reactive to light.  Cardiovascular:     Rate and Rhythm: Normal rate and regular rhythm.  Pulmonary:     Effort: Pulmonary effort is normal.     Breath sounds: Normal breath sounds.  Abdominal:     General: Abdomen is flat. There is no distension.     Palpations: Abdomen  is soft.     Tenderness: There is no abdominal tenderness. There is no guarding or rebound.  Musculoskeletal:        General: No tenderness or deformity.     Right lower leg: No edema.     Left lower leg: No edema.  Skin:    General: Skin is warm and dry.     Capillary Refill: Capillary refill takes less than 2 seconds.  Neurological:     General: No focal deficit present.     Mental Status: He is alert and oriented to person, place, and time.  Psychiatric:     Comments: Intoxicated     ED Results / Procedures / Treatments   Labs (all labs ordered are listed, but only abnormal results are displayed) Labs Reviewed  BASIC METABOLIC PANEL WITH GFR - Abnormal; Notable for the following  components:      Result Value   Glucose, Bld 188 (*)    All other components within normal limits  CBC - Abnormal; Notable for the following components:   Hemoglobin 12.6 (*)    HCT 38.9 (*)    All other components within normal limits  RAPID URINE DRUG SCREEN, HOSP PERFORMED - Abnormal; Notable for the following components:   Tetrahydrocannabinol POSITIVE (*)    All other components within normal limits  URINALYSIS, ROUTINE W REFLEX MICROSCOPIC - Abnormal; Notable for the following components:   Color, Urine AMBER (*)    APPearance HAZY (*)    Bilirubin Urine SMALL (*)    Ketones, ur 5 (*)    Protein, ur 100 (*)    All other components within normal limits  HEPATIC FUNCTION PANEL - Abnormal; Notable for the following components:   Total Protein 6.3 (*)    All other components within normal limits  TSH - Abnormal; Notable for the following components:   TSH 0.182 (*)    All other components within normal limits  CBG MONITORING, ED - Abnormal; Notable for the following components:   Glucose-Capillary 130 (*)    All other components within normal limits  ETHANOL  D-DIMER, QUANTITATIVE  MAGNESIUM  HEMOGLOBIN A1C  HIV ANTIBODY (ROUTINE TESTING W REFLEX)  RPR  IRON AND TIBC  FERRITIN  LIPID PANEL  TROPONIN I (HIGH SENSITIVITY)  TROPONIN I (HIGH SENSITIVITY)    EKG EKG Interpretation Date/Time:  Tuesday Aug 13 2023 06:01:59 EDT Ventricular Rate:  77 PR Interval:  154 QRS Duration:  96 QT Interval:  354 QTC Calculation: 400 R Axis:   12  Text Interpretation: Normal sinus rhythm Normal ECG When compared with ECG of 28-Nov-2016 13:28, PREVIOUS ECG IS PRESENT Confirmed by Elise Guile [phone]) on 08/13/2023 7:15:00 AM  Radiology CT HEAD WO CONTRAST ( ) Result Date: 08/13/2023 CLINICAL DATA:  Mental status change with unknown cause EXAM: CT HEAD WITHOUT CONTRAST TECHNIQUE: Contiguous axial images were obtained from the base of the skull through the vertex without intravenous  contrast. RADIATION DOSE REDUCTION: This exam was performed according to the departmental dose-optimization program which includes automated exposure control, adjustment of the mA and/or kV according to patient size and/or use of iterative reconstruction technique. COMPARISON:  None Available. FINDINGS: Brain: No evidence of acute infarction, hemorrhage, hydrocephalus, extra-axial collection or mass lesion/mass effect. Vascular: No hyperdense vessel or unexpected calcification. Skull: Normal. Negative for fracture or focal lesion. Sinuses/Orbits: No acute finding. IMPRESSION: Negative head CT. Electronically Signed   By: Ronnette Coke M.D.   On: 08/13/2023 07:58   DG Chest 2 View  Result Date: 08/13/2023 CLINICAL DATA:  Syncope. EXAM: CHEST - 2 VIEW COMPARISON:  11/28/2016 FINDINGS: The heart size and mediastinal contours are within normal limits. Both lungs are clear. The visualized skeletal structures are unremarkable. IMPRESSION: No active cardiopulmonary disease. Electronically Signed   By: Kimberley Penman M.D.   On: 08/13/2023 06:30    Procedures Procedures    Medications Ordered in ED Medications  enoxaparin (LOVENOX) injection 40 mg (40 mg Subcutaneous Given 08/13/23 1450)  folic acid (FOLVITE) tablet 1 mg (1 mg Oral Given 08/13/23 1450)  multivitamin with minerals tablet 1 tablet (1 tablet Oral Given 08/13/23 1450)  thiamine (VITAMIN B1) tablet 100 mg (100 mg Oral Given 08/13/23 1450)  lactated ringers bolus 1,000 mL (0 mLs Intravenous Stopped 08/13/23 1030)  lactated ringers bolus 1,000 mL (0 mLs Intravenous Stopped 08/13/23 1257)    ED Course/ Medical Decision Making/ A&P Clinical Course as of 08/13/23 1606  Tue Aug 13, 2023  0822 CT HEAD WO CONTRAST ( ) Negative CT head [TY]  1610 D-Dimer, Quant: <0.27 PE unlikely.  [TY]  [id number] Alcohol, Ethyl (B): <15 [TY]    Clinical Course User Index [TY] Rolinda Climes, DO                                 Medical Decision Making This is a  43 year old male presenting emergency department after syncopal episode.  Afebrile nontachycardic, some soft blood pressures upon arrival, but have improved.  IV fluids ordered.  EKG appears to be normal sinus rhythm without ST segment changes to indicate ischemia.  Appears to be normal sinus rhythm on the monitor.  Initial troponin negative.  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  Will get D-dimer to evaluate for PE.  He is seemingly intoxicated versus encephalopathic.  Will get UDS, ethanol level as well as hepatic function.  He has no leukocytosis to suggest systemic infection.  No significant metabolic derangements or abnormal kidney function on his basic metabolic panel.  Per chart review does not appear to have significant cardiac history, but does not appear to have had any cardiac testing in the past.  He is quite orthostatic here blood pressure dropping down into high 50s with standing.  Given patient's overall clinical picture we will admit for syncope.  Amount and/or Complexity of Data Reviewed Independent Historian:     Details: Wife notes that he is seemingly confused/intoxicated, but did not drink much alcohol External Data Reviewed:     Details: See above Labs: ordered. Decision-making details documented in ED Course. Radiology: ordered. Decision-making details documented in ED Course. ECG/medicine tests: independent interpretation performed.  Risk Decision regarding hospitalization.          Final Clinical Impression(s) / ED Diagnoses Final diagnoses:  Syncope, unspecified syncope type    Rx / DC Orders ED Discharge Orders     None         Rolinda Climes, DO 08/13/23 1606

## 2023-08-13 NOTE — Assessment & Plan Note (Signed)
-   CIWA q6h without standing ativan order - Thiamine, Folate, Multivitamin - TOC resources for substance use

## 2023-08-13 NOTE — ED Provider Triage Note (Signed)
 Emergency Medicine Provider Triage Evaluation Note  Aaron Lindsey , a 43 y.o. male  was evaluated in triage.  Pt complains of frequent falls vs syncope.  States that he has had 5-6 falls at work.  States that it happens right after he stands up.  He states that he feels tired.  Denies any recent illnesses.  Denies CP or SOB.  Review of Systems  Positive: Falls, syncope Negative:   Physical Exam  BP (!) 90/49   Pulse 84   Temp 98.1 F (36.7 C)   Resp 17   SpO2 100%  Gen:   Awake, no distress   Resp:  Normal effort  MSK:   Moves extremities without difficulty  Other:    Medical Decision Making  Medically screening exam initiated at 6:31 AM.  Appropriate orders placed.  Milez Lagueux was informed that the remainder of the evaluation will be completed by another provider, this initial triage assessment does not replace that evaluation, and the importance of remaining in the ED until their evaluation is complete.     Sherel Dikes, PA-C 08/13/23 [phone]

## 2023-08-13 NOTE — Plan of Care (Signed)

## 2023-08-13 NOTE — ED Triage Notes (Signed)
 Patient coming to ED for evaluation of syncopal episodes x 2 this morning.  Family member states "I heard someone fall this morning and I thought it was one of the kids.  When I found him he was in the bathroom and was laying on the floor.  I stood him up and he passed out again.  He was able to crawl to the bed after that."  Family member reports confusion.  Last seen normal prior to going to bed around 10 PM and Midnight.  No reports of headache.  No recent illness.

## 2023-08-13 NOTE — ED Notes (Signed)
 Nt called CCMD to put pt on monitor

## 2023-08-13 NOTE — Assessment & Plan Note (Addendum)
 Likely secondary to constellation of: concomitant alcohol/phentermine/chronic opioid use. Strong fam hx cardiac disease- both brother (45 year old) and father deceased reportedly from heart disease; thus necessitating further workup - Volume resuscitation with IV fluids, s/p 2 L LR bolus; hopeful for improvement after fluid rescusitation - Risk stratification labs with TSH, A1c, HIV, lipid panel and RPR - PT/OT - Add TED hose if persistent orthostasis - Echocardiogram - reasonable given longstanding hx syncope, family hx - AM CMP

## 2023-08-14 ENCOUNTER — Observation Stay (HOSPITAL_COMMUNITY)

## 2023-08-14 DIAGNOSIS — R55 Syncope and collapse: Secondary | ICD-10-CM | POA: Diagnosis not present

## 2023-08-14 DIAGNOSIS — Z789 Other specified health status: Secondary | ICD-10-CM

## 2023-08-14 LAB — ECHOCARDIOGRAM COMPLETE
AV Mean grad: 4 mmHg
AV Peak grad: 8.5 mmHg
Ao pk vel: 1.46 m/s
Area-P 1/2: 3.83 cm2
Calc EF: 62.3 %
Height: 70 in
S' Lateral: 3.4 cm
Single Plane A2C EF: 62.5 %
Single Plane A4C EF: 63.3 %
Weight: 3174.62 [oz_av]

## 2023-08-14 LAB — CBC
HCT: 37.1 % — ABNORMAL LOW (ref 39.0–52.0)
Hemoglobin: 12.1 g/dL — ABNORMAL LOW (ref 13.0–17.0)
MCH: 28.2 pg (ref 26.0–34.0)
MCHC: 32.6 g/dL (ref 30.0–36.0)
MCV: 86.5 fL (ref 80.0–100.0)
Platelets: 189 10*3/uL (ref 150–400)
RBC: 4.29 MIL/uL (ref 4.22–5.81)
RDW: 13.6 % (ref 11.5–15.5)
WBC: 3 10*3/uL — ABNORMAL LOW (ref 4.0–10.5)
nRBC: 0 % (ref 0.0–0.2)

## 2023-08-14 LAB — COMPREHENSIVE METABOLIC PANEL WITH GFR
ALT: 21 U/L (ref 0–44)
AST: 20 U/L (ref 15–41)
Albumin: 3 g/dL — ABNORMAL LOW (ref 3.5–5.0)
Alkaline Phosphatase: 62 U/L (ref 38–126)
Anion gap: 6 (ref 5–15)
BUN: 13 mg/dL (ref 6–20)
CO2: 30 mmol/L (ref 22–32)
Calcium: 9 mg/dL (ref 8.9–10.3)
Chloride: 110 mmol/L (ref 98–111)
Creatinine, Ser: 1.3 mg/dL — ABNORMAL HIGH (ref 0.61–1.24)
GFR, Estimated: >60 mL/min (ref >60)
Glucose, Bld: 116 mg/dL — ABNORMAL HIGH (ref 70–99)
Potassium: 3.8 mmol/L (ref 3.5–5.1)
Sodium: 146 mmol/L — ABNORMAL HIGH (ref 135–145)
Total Bilirubin: 0.7 mg/dL (ref 0.0–1.2)
Total Protein: 5.5 g/dL — ABNORMAL LOW (ref 6.5–8.1)

## 2023-08-14 LAB — T4, FREE: Free T4: 0.64 ng/dL (ref 0.61–1.12)

## 2023-08-14 MED ORDER — FOLIC ACID 1 MG PO TABS: 1.0000 mg | ORAL_TABLET | Freq: Every day | ORAL | Status: AC

## 2023-08-14 MED ORDER — VITAMIN B-1 100 MG PO TABS: 100.0000 mg | ORAL_TABLET | Freq: Every day | ORAL | Status: AC

## 2023-08-14 MED ORDER — ADULT MULTIVITAMIN W/MINERALS CH: 1.0000 | ORAL_TABLET | Freq: Every day | ORAL | Status: AC

## 2023-08-14 NOTE — Discharge Summary (Signed)
 Family Medicine Teaching [facility] Discharge Summary  Patient name: Aaron Lindsey Medical record number: [id number] Date of birth: February 15, 1981 Age: 43 y.o. Gender: male Date of Admission: 08/13/2023  Date of Discharge: 08/14/2023 Admitting Physician: Vallorie Gayer, DO  Primary Care Provider: Charle Congo, MD Consultants: None  Indication for Hospitalization: Syncope x 2  Brief Hospital Course:  Aaron Lindsey is a 43 y.o.male with a history of chronic pain, obesity who was admitted to the [facility] Medicine Teaching Service at [facility] for syncope.   His hospital course is detailed below:  Syncope Patient found down at home by wife prompting presentation to the ED. CXR and CT head negative.  Extensive lab workup without any abnormal results (TSH, A1c, HIV, RPR, iron studies, TSH with reflex T4) to explain syncopal episodes.  Patient underwent echocardiogram which showed normal LVEF 60-65%.  Patient overall doing well, and discharged home with Zio patch for long-term monitoring.  Anemia On arrival to the ED hemoglobin found to be slightly low at 12.6.  Iron panel WNL.  Patient without any obvious signs of bleeding.  Repeat hemoglobin stable, and patient discharged in good condition.  Other chronic conditions were medically managed with home medications and formulary alternatives as necessary (chronic pain)  PCP Follow-up Recommendations: Referral placed to cardiology for further workup given family history of cardiac disease/early death.  Please ensure patient follows up. Zio patch ordered on discharge; please ensure patient uses appropriately. Consider recheck CBC to trend hemoglobin. Consider outpatient sleep study for OSA.     Discharge Diagnoses/Problem List:  Principal Problem:   Syncope Active Problems:   Alcohol use   Anemia   Chronic health problem  Disposition: Home  Discharge Condition: Stable  Discharge Exam:  General: Sitting up in chair at bedside, very  sleepy appearing, keeps eyes closed most of the time we are talking. Cardiovascular: RRR, no murmurs.  Radial pulses 2+. Respiratory: CTA bilaterally.  Normal work of breathing on room air. Abdomen: Normoactive bowel sounds.  Soft, nontender, nondistended.  No hepatosplenomegaly. Extremities: No LE edema.  Skin warm and dry.  Significant Procedures: none  Significant Labs and Imaging:  Recent Labs  Lab 08/13/23 0616 08/14/23 1002  WBC 4.0 3.0*  HGB 12.6* 12.1*  HCT 38.9* 37.1*  PLT 216 189   Recent Labs  Lab 08/13/23 0616 08/13/23 0734 08/13/23 1034 08/14/23 1002  NA 139  --   --  146*  K 3.7  --   --  3.8  CL 104  --   --  110  CO2 23  --   --  30  GLUCOSE 188*  --   --  116*  BUN 12  --   --  13  CREATININE 1.12  --   --  1.30*  CALCIUM 8.9  --   --  9.0  MG  --   --  1.9  --   ALKPHOS  --  58  --  62  AST  --  23  --  20  ALT  --  27  --  21  ALBUMIN  --  3.6  --  3.0*    5/7 echocardiogram:  1. Left ventricular ejection fraction, by estimation, is 60 to 65%. The  left ventricle has normal function. The left ventricle has no regional  wall motion abnormalities. Left ventricular diastolic parameters were  normal.   2. Right ventricular systolic function is normal. The right ventricular  size is mildly enlarged.   3. The mitral  valve is normal in structure. Trivial mitral valve  regurgitation. No evidence of mitral stenosis.   4. The aortic valve is tricuspid. Aortic valve regurgitation is not  visualized. No aortic stenosis is present. Aortic valve mean gradient  measures 4.0 mmHg. Aortic valve Vmax measures 1.46 m/s.   5. The inferior vena cava is normal in size with greater than 50%  respiratory variability, suggesting right atrial pressure of 3 mmHg.   Results/Tests Pending at Time of Discharge: None  Discharge Medications:  Allergies as of 08/14/2023   No Known Allergies      Medication List     PAUSE taking these medications    phentermine 37.5  MG tablet Wait to take this until your doctor or other care provider tells you to start again. Commonly known as: ADIPEX-P Take 37.5 mg by mouth every morning.       STOP taking these medications    methocarbamol  500 MG tablet Commonly known as: ROBAXIN    predniSONE  10 MG (21) Tbpk tablet Commonly known as: STERAPRED UNI-PAK 21 TAB       TAKE these medications    albuterol 108 (90 Base) MCG/ACT inhaler Commonly known as: VENTOLIN HFA Inhale 2 puffs into the lungs every 4 (four) hours as needed for wheezing or shortness of breath.   folic acid 1 MG tablet Commonly known as: FOLVITE Take 1 tablet (1 mg total) by mouth daily. Start taking on: Aug 15, 2023   lidocaine 5 % ointment Commonly known as: XYLOCAINE Apply 1 Application topically 3 (three) times daily as needed for moderate pain (pain score 4-6).   meloxicam 7.5 MG tablet Commonly known as: MOBIC Take 7.5 mg by mouth daily.   multivitamin with minerals Tabs tablet Take 1 tablet by mouth daily. Start taking on: Aug 15, 2023   oxyCODONE-acetaminophen 5-325 MG tablet Commonly known as: PERCOCET/ROXICET Take 1 tablet by mouth every 8 (eight) hours as needed for severe pain.   sildenafil 20 MG tablet Commonly known as: REVATIO Take 60 mg by mouth as needed (ED).   thiamine 100 MG tablet Commonly known as: Vitamin B-1 Take 1 tablet (100 mg total) by mouth daily. Start taking on: Aug 15, 2023   Vitamin D  (Ergocalciferol ) 1.25 MG (50000 UNIT) Caps capsule Commonly known as: DRISDOL Take 50,000 Units by mouth once a week.        Discharge Instructions: Please refer to Patient Instructions section of EMR for full details.  Patient was counseled important signs and symptoms that should prompt return to medical care, changes in medications, dietary instructions, activity restrictions, and follow up appointments.   Follow-Up Appointments:   Omar Bibber, DO 08/14/2023, 3:20 PM PGY-1, [facility] Family  Medicine

## 2023-08-14 NOTE — Assessment & Plan Note (Addendum)
 Still suspect this is likely 2/2 constellation of: concomitant alcohol/phentermine/chronic opioid use.  Given strong fam hx cardiac disease - both brother (45 year old) and father deceased reportedly from heart disease - awaiting Echo today.  Risk stratification labs unremarkable with exception of TSH somewhat low so T4 was checked which was WNL. - PT/OT - Add TED hose if persistent orthostasis - Echocardiogram - reasonable given longstanding hx syncope, family hx

## 2023-08-14 NOTE — Plan of Care (Signed)

## 2023-08-14 NOTE — Hospital Course (Addendum)
 Aaron Lindsey is a 43 y.o.male with a history of chronic pain, obesity who was admitted to the [facility] Medicine Teaching Service at [facility] for syncope.   His hospital course is detailed below:  Syncope Patient found down at home by wife prompting presentation to the ED. CXR and CT head negative.  Extensive lab workup without any abnormal results (TSH, A1c, HIV, RPR, iron studies, TSH with reflex T4) to explain syncopal episodes.  Patient underwent echocardiogram which showed normal LVEF 60-65%.  Patient overall doing well, and discharged home with Zio patch for long-term monitoring.  Anemia On arrival to the ED hemoglobin found to be slightly low at 12.6.  Iron panel WNL.  Patient without any obvious signs of bleeding.  Repeat hemoglobin stable, and patient discharged in good condition.  Other chronic conditions were medically managed with home medications and formulary alternatives as necessary (chronic pain)  PCP Follow-up Recommendations: Referral placed to cardiology for further workup given family history of cardiac disease/early death.  Please ensure patient follows up. Zio patch ordered on discharge; please ensure patient uses appropriately. Consider recheck CBC to trend hemoglobin. Consider outpatient sleep study for OSA.

## 2023-08-14 NOTE — Assessment & Plan Note (Addendum)
 Unknown etiology, but in the setting of chronic intermittent abdominal pain. No overt signs of bleeding/melena/hematochezia/frank hematuria.  He does report frequent black stools, though also reports he takes Pepto-Bismol often.  Does not take an iron supplement.  Iron panel WNL.  Recheck this AM Hgb is stable. - Should abdominal pain present/persist, consider CT abdomen/pelvis

## 2023-08-14 NOTE — Discharge Instructions (Signed)
 Dear Antoinette Kirschner,   Thank you for letting us  participate in your care!  We are so glad you were doing better.  You were admitted to the hospital because of syncope (passing out).  We checked multiple labs, fortunately none of these were concerning.  We also got an echocardiogram which showed **.  We recommend that you follow-up with cardiology outpatient, and we have placed a referral for you.  They should call you to set up an appointment.  We have also ordered a Zio patch; this is a wearable heart monitor.  Please keep this on for 7 days as instructed.   POST-HOSPITAL & CARE INSTRUCTIONS We recommend following up with your PCP within 1 week from being discharged from the hospital. Please let PCP/Specialists know of any changes in medications that were made which you will be able to see in the medications section of this packet.  DOCTOR'S APPOINTMENTS & FOLLOW UP No future appointments.   Thank you for choosing [facility]! Take care and be well!  Family Medicine Teaching Service Inpatient Team [place]  [facility]  [address] [place], [place] [address] [phone]

## 2023-08-14 NOTE — Progress Notes (Signed)
*  PRELIMINARY RESULTS* Echocardiogram 2D Echocardiogram has been performed.  Aaron Lindsey 08/14/2023, 9:42 AM

## 2023-08-14 NOTE — Assessment & Plan Note (Signed)
 Chronic pain: Reviewed PDMP, monthly fills for oxycodone-acetaminophen 5-3 25#90 tablets for 30 days.  Continue home regimen TID. Obesity: Hold home phentermine in setting of syncope.

## 2023-08-14 NOTE — Progress Notes (Addendum)
 Daily Progress Note Intern Pager: [phone]  Patient name: Aaron Lindsey Medical record number: [id number] Date of birth: 10/11/1980 Age: 43 y.o. Gender: male  Primary Care Provider: Charle Congo, MD Consultants: None Code Status: Full  Pt Overview and Major Events to Date:  5/6-admitted  Assessment and Plan: Aaron Lindsey is a 43 year old male who presented with syncopal event, likely multifactorial.  Stable, hopefully discharge home soon pending outcome of echocardiogram. Assessment & Plan Syncope Still suspect this is likely 2/2 constellation of: concomitant alcohol/phentermine/chronic opioid use.  Given strong fam hx cardiac disease - both brother (66 year old) and father deceased reportedly from heart disease - awaiting Echo today.  Risk stratification labs unremarkable with exception of TSH somewhat low so T4 was checked which was WNL. - PT/OT - Add TED hose if persistent orthostasis - Echocardiogram - reasonable given longstanding hx syncope, family hx Alcohol use CIWA's have been negative.  On rereview patient does not have extensive alcohol use history.  Will discontinue CIWA's. - Thiamine, Folate, Multivitamin - TOC resources for substance use Anemia Unknown etiology, but in the setting of chronic intermittent abdominal pain. No overt signs of bleeding/melena/hematochezia/frank hematuria.  He does report frequent black stools, though also reports he takes Pepto-Bismol often.  Does not take an iron supplement.  Iron panel WNL.  Recheck this AM Hgb is stable. - Should abdominal pain present/persist, consider CT abdomen/pelvis Chronic health problem Chronic pain: Reviewed PDMP, monthly fills for oxycodone-acetaminophen 5-3 25#90 tablets for 30 days.  Continue home regimen TID. Obesity: Hold home phentermine in setting of syncope.   FEN/GI: Regular diet PPx: Lovenox Dispo:Home pending clinical improvement . Barriers include completion of further workup, clinical  stability.   Subjective:  Patient seen this morning sitting up in chair at bedside just after finishing working with OT.  He is very sleepy, but cannot really tell me why.  He denies any concerns.  When asked about bleeding, he denies blood in urine and stool, but then reports that he has frequent black stools and he attributes this to his frequent Pepto-Bismol use.  Objective: Temp:  [97.4 F (36.3 C)-98.4 F (36.9 C)] 97.8 F (36.6 C) (05/07 0524) Pulse Rate:  [88-98] 98 (05/07 0524) Resp:  [13-19] 18 (05/07 0524) BP: (101-124)/(64-77) 113/74 (05/07 0524) SpO2:  [98 %-100 %] 98 % (05/07 0524) Weight:  [90 kg] 90 kg (05/06 1449) Physical Exam: General: Sitting up in chair at bedside, very sleepy appearing, keeps eyes closed most of the time we are talking. Cardiovascular: RRR, no murmurs.  Radial pulses 2+. Respiratory: CTA bilaterally.  Normal work of breathing on room air. Abdomen: Normoactive bowel sounds.  Soft, nontender, nondistended.  No hepatosplenomegaly. Extremities: No LE edema.  Skin warm and dry.  Laboratory: Most recent CBC Lab Results  Component Value Date   WBC 4.0 08/13/2023   HGB 12.6 (L) 08/13/2023   HCT 38.9 (L) 08/13/2023   MCV 86.1 08/13/2023   PLT 216 08/13/2023   Most recent BMP    Latest Ref Rng & Units 08/13/2023    6:16 AM  BMP  Glucose 70 - 99 mg/dL 324   BUN 6 - 20 mg/dL 12   Creatinine 4.01 - 1.24 mg/dL 0.27   Sodium 253 - 664 mmol/L 139   Potassium 3.5 - 5.1 mmol/L 3.7   Chloride 98 - 111 mmol/L 104   CO2 22 - 32 mmol/L 23   Calcium 8.9 - 10.3 mg/dL 8.9     Aaron Bibber,  DO 08/14/2023, 7:07 AM  PGY-1, [facility] Family Medicine FPTS Intern pager: [phone], text pages welcome Secure chat group [facility] Teaching Service

## 2023-08-14 NOTE — Evaluation (Signed)
 Occupational Therapy Evaluation Patient Details Name: Aaron Lindsey MRN: [id number] DOB: [birth year] Today's Date: 08/14/2023   History of Present Illness   Pt is a 43 y/o M presenting to ED on 5/6 after 2 syncopal episodes at home, confused. Workup ongoing. PMH includes vasovagal episodes.     Clinical Impressions Pt reports ind at baseline with ADLs/functional mobility, lives with family. 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 Pt currently needing supervision for ADLs, ind with bed mobility and supervision for transfers without AD. Pt keeping eyes closed frequently during session, states it is because he does not have his contacts and vision is blurry, however reports it is still baseline for him. Pt denies asymptomatic throughout session, see below for BP measures. Pt presenting with impairments listed below, will follow acutely. Anticipate no OT follow up needs at d/c.   BP supine 116/87 (95) BP seated 119/82 (94) BP standing 121/82 (94) BP standing x5 min 124/82 (96)     If plan is discharge home, recommend the following:   A little help with bathing/dressing/bathroom;A little help with walking and/or transfers;Assist for transportation;Help with stairs or ramp for entrance;Direct supervision/assist for medications management;Direct supervision/assist for financial management;Assistance with cooking/housework     Functional Status Assessment   Patient has had a recent decline in their functional status and demonstrates the ability to make significant improvements in function in a reasonable and predictable amount of time.     Equipment Recommendations   None recommended by OT     Recommendations for Other Services   PT consult     Precautions/Restrictions   Precautions Precautions: Fall Restrictions Weight Bearing Restrictions Per Provider Order: No     Mobility Bed Mobility Overal bed mobility: Independent                  Transfers Overall  transfer level: Needs assistance Equipment used: None Transfers: Sit to/from Stand Sit to Stand: Supervision                  Balance Overall balance assessment: Mild deficits observed, not formally tested                                         ADL either performed or assessed with clinical judgement   ADL Overall ADL's : Needs assistance/impaired Eating/Feeding: Independent;Sitting   Grooming: Oral care;Wash/dry face;Standing   Upper Body Bathing: Standing;Supervision/ safety   Lower Body Bathing: Supervison/ safety;Sit to/from stand;Sitting/lateral leans   Upper Body Dressing : Supervision/safety;Sitting   Lower Body Dressing: Supervision/safety;Sitting/lateral leans Lower Body Dressing Details (indicate cue type and reason): dons shoes Toilet Transfer: Supervision/safety;Ambulation;Regular Social worker and Hygiene: Supervision/safety Toileting - Clothing Manipulation Details (indicate cue type and reason): clothing mgmt with standing urination     Functional mobility during ADLs: Supervision/safety       Vision Baseline Vision/History: 1 Wears glasses (wears contacts) Additional Comments: Pt keeping eyes closed most of session, denies dizziness, states it is because he does not have his contacts in and "can't see anything" without his contacts, endorses blurred vision, but states it is baseline for unaided vision. Appears to be [facility] for BADL tasks     Perception         Praxis         Pertinent Vitals/Pain Pain Assessment Pain Assessment: Faces Pain Location: generalized/back Pain Descriptors / Indicators: Discomfort  Pain Intervention(s): Limited activity within patient's tolerance, Monitored during session, Repositioned     Extremity/Trunk Assessment Upper Extremity Assessment Upper Extremity Assessment: Overall WFL for tasks assessed   Lower Extremity Assessment Lower Extremity Assessment: Defer to PT  evaluation   Cervical / Trunk Assessment Cervical / Trunk Assessment: Normal   Communication Communication Communication: No apparent difficulties   Cognition Arousal: Lethargic Behavior During Therapy: WFL for tasks assessed/performed, Flat affect Cognition: No family/caregiver present to determine baseline             OT - Cognition Comments: pt sleeping upon arrival, incr time to rouse and keeps eyes closed frequently during session, answers all questions appropriately with incr time and aware of reason for hospitalization.                 Following commands: Intact       Cueing  General Comments   Cueing Techniques: Verbal cues  VSS, see note for BP   Exercises     Shoulder Instructions      Home Living Family/patient expects to be discharged to:: Private residence Living Arrangements: Spouse/significant other;Children (adult children) Available Help at Discharge: Family;Available PRN/intermittently Type of Home: House Home Access: Level entry     Home Layout: Two level     Bathroom Shower/Tub: Tub/shower unit         Home Equipment: None          Prior Functioning/Environment Prior Level of Function : Independent/Modified Independent               ADLs Comments: works as a Geophysical data processor for [employer]    OT Problem List: Decreased strength;Decreased range of motion;Decreased activity tolerance;Impaired balance (sitting and/or standing);Impaired vision/perception;Decreased cognition;Decreased safety awareness   OT Treatment/Interventions: Self-care/ADL training;Therapeutic exercise;DME and/or AE instruction;Energy conservation;Therapeutic activities;Patient/family education;Balance training;Visual/perceptual remediation/compensation;Cognitive remediation/compensation      OT Goals(Current goals can be found in the care plan section)   Acute Rehab OT Goals Patient Stated Goal: did not state OT Goal Formulation: With patient Time For Goal  Achievement: 08/28/23 Potential to Achieve Goals: Good ADL Goals Pt Will Perform Upper Body Dressing: Independently;sitting Pt Will Perform Lower Body Dressing: Independently;sitting/lateral leans;sit to/from stand Pt Will Transfer to Toilet: Independently;ambulating;regular height toilet Pt Will Perform Tub/Shower Transfer: Tub transfer;Shower transfer;ambulating;shower seat   OT Frequency:  Min 1X/week    Co-evaluation              AM-PAC OT "6 Clicks" Daily Activity     Outcome Measure Help from another person eating meals?: None Help from another person taking care of personal grooming?: A Little Help from another person toileting, which includes using toliet, bedpan, or urinal?: A Little Help from another person bathing (including washing, rinsing, drying)?: A Little Help from another person to put on and taking off regular upper body clothing?: A Little Help from another person to put on and taking off regular lower body clothing?: A Little 6 Click Score: 19   End of Session Equipment Utilized During Treatment: Gait belt Nurse Communication: Mobility status  Activity Tolerance: Patient tolerated treatment well Patient left: in chair;with call bell/phone within reach;with chair alarm set  OT Visit Diagnosis: Unsteadiness on feet (R26.81);Other abnormalities of gait and mobility (R26.89);Muscle weakness (generalized) (M62.81);History of falling (Z91.81)                Time: 1191-4782 OT Time Calculation (min): 22 min Charges:  OT General Charges $OT Visit: 1 Visit OT Evaluation $OT Eval Low  Complexity: 1 Low  Aaron Lindsey, OTD, OTR/L SecureChat Preferred Acute Rehab [phone]   Aaron Lindsey 08/14/2023, 8:48 AM

## 2023-08-14 NOTE — Evaluation (Signed)
 Physical Therapy Brief Evaluation and Discharge Note Patient Details Name: Aaron Lindsey MRN: [id number] DOB: April 25, 1980 Today's Date: 08/14/2023   History of Present Illness  Pt is a 43 y/o M presenting to ED on 5/6 after 2 syncopal episodes at home, confused. Workup ongoing. PMH includes vasovagal episodes.  Clinical Impression  Pt doing well with mobility and no further PT needed.  Ready for dc from PT standpoint.        PT Assessment Patient does not need any further PT services  Assistance Needed at Discharge  PRN    Equipment Recommendations None recommended by PT  Recommendations for Other Services       Precautions/Restrictions Precautions Precautions: None Restrictions Weight Bearing Restrictions Per Provider Order: No        Mobility  Bed Mobility Rolling: Independent Supine/Sidelying to sit: Independent Sit to supine/sidelying: Independent    Transfers Overall transfer level: Independent Equipment used: None Transfers: Sit to/from Stand Sit to Stand: Modified independent (Device/Increase time)                Ambulation/Gait Ambulation/Gait assistance: Independent Gait Distance (Feet): 350 Feet Assistive device: None Gait Pattern/deviations: WFL(Within Functional Limits) Gait Speed: Pace WFL General Gait Details: Steady gait  Home Activity Instructions    Stairs            Modified Rankin (Stroke Patients Only)        Balance Overall balance assessment: Independent                        Pertinent Vitals/Pain PT - Brief Vital Signs All Vital Signs Stable: Yes Pain Assessment Pain Assessment: No/denies pain     Home Living Family/patient expects to be discharged to:: Private residence Living Arrangements: Spouse/significant other;Children Available Help at Discharge: Family Home Environment: Level entry;Stairs in home  Stairs-Number of Steps: flight Home Equipment: None        Prior Function Level of  Independence: Independent Comments: works for [employer]    UE/LE Assessment   UE ROM/Strength/Tone/Coordination: [employer]    LE ROM/Strength/Tone/Coordination: [employer]      Communication   Communication Communication: No apparent difficulties     Cognition Overall Cognitive Status: Appears within functional limits for tasks assessed/performed       General Comments      Exercises     Assessment/Plan    PT Problem List         PT Visit Diagnosis History of falling (Z91.81)    No Skilled PT Patient is independent with all acitivity/mobility   Co-evaluation                AMPAC 6 Clicks Help needed turning from your back to your side while in a flat bed without using bedrails?: None Help needed moving from lying on your back to sitting on the side of a flat bed without using bedrails?: None Help needed moving to and from a bed to a chair (including a wheelchair)?: None Help needed standing up from a chair using your arms (e.g., wheelchair or bedside chair)?: None Help needed to walk in hospital room?: None Help needed climbing 3-5 steps with a railing? : None 6 Click Score: 24      End of Session   Activity Tolerance: Patient tolerated treatment well Patient left: in bed;with call bell/phone within reach Nurse Communication: Mobility status PT Visit Diagnosis: History of falling (Z91.81)     Time: 9528-4132 PT Time Calculation (min) (ACUTE ONLY): 8  min  Charges:   PT Evaluation $PT Eval Low Complexity: 1 Low      [facility] PT Acute Rehabilitation Services Office [phone]   [facility]  08/14/2023, 2:45 PM

## 2023-08-14 NOTE — Assessment & Plan Note (Addendum)
 CIWA's have been negative.  On rereview patient does not have extensive alcohol use history.  Will discontinue CIWA's. - Thiamine, Folate, Multivitamin - TOC resources for substance use

## 2023-12-09 NOTE — Progress Notes (Deleted)
 Cardiology Office Note:   Date:  12/09/2023  ID:  Lonni Jama Gentry, DOB Sep 23, 1980, MRN [id number] PCP:  Shelda Atlas, MD  [facility] Providers Cardiologist:  Wendel Haws, MD Referring MD: Rosalynn Camie CROME, MD  Chief Complaint/Reason for Referral: Syncope ASSESSMENT:    1. Syncope, unspecified syncope type   2. BMI 28.0-28.9,adult     PLAN:   In order of problems listed above: Syncope: Will follow-up monitor; referred to dysautonomia clinic Elevated BMI: Diet and exercise modification        {Are you ordering a CV Procedure (e.g. stress test, cath, DCCV, TEE, etc)?   Press F2        :[id number]}   Dispo:  No follow-ups on file.       I spent *** minutes reviewing all clinical data during and prior to this visit including all relevant imaging studies, laboratories, clinical information from other health systems and prior notes from both Cardiology and other specialties, interviewing the patient, conducting a complete physical examination, and coordinating care in order to formulate a comprehensive and personalized evaluation and treatment plan.   History of Present Illness:    FOCUSED PROBLEM LIST:   Chronic pain syndrome BMI 28 Recurrent presyncope/syncope 2018 >> after exercise; resolved with IVF May 2025 Monitor pending*** Normal diastolic function, no significant valve issues, EF 60 to 65% TTE May 2025  September 2025: The patient is a 43 year old male with the above listed medical problems referred for recommendations regarding the patient's recent syncopal episode.  The patient had a syncopal episode in 2018 as well.  Apparently he had gone for a long time without working out.  On the day of his second workout he felt quite lightheaded.  He did not eat very much that day and had been using a supplement.  He presented to the emergency department and was given IV fluids with resolution of his symptoms.  He seems to be well up until May 2025.  Apparently his wife  heard a thud at 4 AM and saw the patient lying on the bathroom floor naked.  He had had some alcohol the night before.  Apparently he stood up and then passed out again.  He presented to the emergency department.  His UDS was positive for THC.  His D-dimer was negative.  Troponins were also negative.  His THS was low however his free T4 was normal and a hemoglobin A1c was normal.  An echocardiogram was reassuring.  He was discharged with a Zio patch.       Current Medications: No outpatient medications have been marked as taking for the 12/17/23 encounter (Appointment) with Aashish Hamm K, MD.     Review of Systems:   Please see the history of present illness.    All other systems reviewed and are negative.     EKGs/Labs/Other Test Reviewed:   EKG: May 2025 normal sinus rhythm  EKG Interpretation Date/Time:    Ventricular Rate:    PR Interval:    QRS Duration:    QT Interval:    QTC Calculation:   R Axis:      Text Interpretation:          CARDIAC STUDIES: Refer to CV Procedures and Imaging Tabs   Risk Assessment/Calculations:   {Does this patient have ATRIAL FIBRILLATION?:[phone]}      Physical Exam:   VS:  There were no vitals taken for this visit.   No BP recorded.  {Refresh Note OR Click here to enter BP  :  1}***   Wt Readings from Last 3 Encounters:  08/13/23 198 lb 6.6 oz (90 kg)  10/19/22 198 lb (89.8 kg)  08/13/21 190 lb (86.2 kg)      GENERAL:  No apparent distress, AOx3 HEENT:  No carotid bruits, +2 carotid impulses, no scleral icterus CAR: RRR Irregular RR*** no murmurs***, gallops, rubs, or thrills RES:  Clear to auscultation bilaterally ABD:  Soft, nontender, nondistended, positive bowel sounds x 4 VASC:  +2 radial pulses, +2 carotid pulses NEURO:  CN 2-12 grossly intact; motor and sensory grossly intact PSYCH:  No active depression or anxiety EXT:  No edema, ecchymosis, or cyanosis  Signed, Lurena MARLA Red, MD  12/09/2023 8:05 AM    [facility] [address] [place], [place], [place]  [address] Phone: [phone]; Fax: [phone]   Note:  This document was prepared using Dragon voice recognition software and may include unintentional dictation errors.

## 2023-12-17 ENCOUNTER — Ambulatory Visit: Attending: Internal Medicine | Admitting: Internal Medicine

## 2023-12-17 DIAGNOSIS — R55 Syncope and collapse: Secondary | ICD-10-CM

## 2023-12-17 DIAGNOSIS — Z6828 Body mass index (BMI) 28.0-28.9, adult: Secondary | ICD-10-CM
